# Patient Record
Sex: Male | Born: 1935 | Race: White | Hispanic: No | Marital: Married | Smoking: Never smoker
Health system: Southern US, Community
[De-identification: ages and names within clinical notes are randomized; demographics above are authoritative.]

## PROBLEM LIST (undated history)

## (undated) DIAGNOSIS — G459 Transient cerebral ischemic attack, unspecified: Secondary | ICD-10-CM

## (undated) DIAGNOSIS — I10 Essential (primary) hypertension: Secondary | ICD-10-CM

## (undated) DIAGNOSIS — J309 Allergic rhinitis, unspecified: Secondary | ICD-10-CM

## (undated) DIAGNOSIS — H409 Unspecified glaucoma: Secondary | ICD-10-CM

## (undated) DIAGNOSIS — I350 Nonrheumatic aortic (valve) stenosis: Secondary | ICD-10-CM

## (undated) DIAGNOSIS — M1990 Unspecified osteoarthritis, unspecified site: Secondary | ICD-10-CM

## (undated) DIAGNOSIS — C61 Malignant neoplasm of prostate: Secondary | ICD-10-CM

## (undated) DIAGNOSIS — E785 Hyperlipidemia, unspecified: Secondary | ICD-10-CM

## (undated) DIAGNOSIS — I442 Atrioventricular block, complete: Secondary | ICD-10-CM

## (undated) HISTORY — DX: Unspecified osteoarthritis, unspecified site: M19.90

## (undated) HISTORY — DX: Allergic rhinitis, unspecified: J30.9

## (undated) HISTORY — DX: Essential (primary) hypertension: I10

## (undated) HISTORY — DX: Transient cerebral ischemic attack, unspecified: G45.9

## (undated) HISTORY — PX: OTHER SURGICAL HISTORY: SHX169

## (undated) HISTORY — DX: Malignant neoplasm of prostate: C61

## (undated) HISTORY — DX: Nonrheumatic aortic (valve) stenosis: I35.0

## (undated) HISTORY — DX: Unspecified glaucoma: H40.9

## (undated) HISTORY — DX: Atrioventricular block, complete: I44.2

## (undated) HISTORY — DX: Hyperlipidemia, unspecified: E78.5

---

## 2001-12-24 HISTORY — PX: OTHER SURGICAL HISTORY: SHX169

## 2006-07-11 ENCOUNTER — Ambulatory Visit: Payer: Self-pay | Admitting: Internal Medicine

## 2006-07-29 ENCOUNTER — Ambulatory Visit: Payer: Self-pay | Admitting: Family Medicine

## 2006-08-07 ENCOUNTER — Ambulatory Visit: Payer: Self-pay | Admitting: Internal Medicine

## 2006-10-18 ENCOUNTER — Ambulatory Visit: Payer: Self-pay | Admitting: *Deleted

## 2006-11-11 ENCOUNTER — Ambulatory Visit: Payer: Self-pay | Admitting: Internal Medicine

## 2006-12-24 DIAGNOSIS — G459 Transient cerebral ischemic attack, unspecified: Secondary | ICD-10-CM

## 2006-12-24 HISTORY — DX: Transient cerebral ischemic attack, unspecified: G45.9

## 2006-12-24 HISTORY — PX: OTHER SURGICAL HISTORY: SHX169

## 2006-12-30 ENCOUNTER — Ambulatory Visit: Payer: Self-pay | Admitting: Internal Medicine

## 2007-01-02 ENCOUNTER — Ambulatory Visit: Payer: Self-pay

## 2007-01-02 ENCOUNTER — Encounter: Payer: Self-pay | Admitting: Cardiology

## 2007-01-29 ENCOUNTER — Ambulatory Visit: Payer: Self-pay | Admitting: Internal Medicine

## 2007-01-29 LAB — CONVERTED CEMR LAB
ALT: 16 U/L
Albumin: 3.2 g/dL — ABNORMAL LOW
BUN: 24 mg/dL — ABNORMAL HIGH
CO2: 31 meq/L
Calcium: 9.4 mg/dL
Chloride: 109 meq/L
Cholesterol: 158 mg/dL
Creatinine, Ser: 1.4 mg/dL
GFR calc Af Amer: 62 mL/min
GFR calc non Af Amer: 51 mL/min
Glucose, Bld: 96 mg/dL
HDL: 57.5 mg/dL
LDL Cholesterol: 81 mg/dL
Phosphorus: 3.7 mg/dL
Potassium: 5.1 meq/L
Sodium: 142 meq/L
Total CHOL/HDL Ratio: 2.7
Triglycerides: 99 mg/dL
VLDL: 20 mg/dL

## 2007-03-07 ENCOUNTER — Ambulatory Visit: Payer: Self-pay | Admitting: Internal Medicine

## 2007-04-11 ENCOUNTER — Ambulatory Visit: Payer: Self-pay | Admitting: Internal Medicine

## 2007-05-26 ENCOUNTER — Encounter: Payer: Self-pay | Admitting: Internal Medicine

## 2007-06-30 ENCOUNTER — Encounter: Payer: Self-pay | Admitting: Internal Medicine

## 2007-06-30 DIAGNOSIS — M109 Gout, unspecified: Secondary | ICD-10-CM

## 2007-06-30 DIAGNOSIS — I10 Essential (primary) hypertension: Secondary | ICD-10-CM | POA: Insufficient documentation

## 2007-07-09 ENCOUNTER — Ambulatory Visit: Payer: Self-pay | Admitting: Internal Medicine

## 2007-07-09 DIAGNOSIS — Z8679 Personal history of other diseases of the circulatory system: Secondary | ICD-10-CM | POA: Insufficient documentation

## 2007-07-09 DIAGNOSIS — E785 Hyperlipidemia, unspecified: Secondary | ICD-10-CM

## 2007-07-10 LAB — CONVERTED CEMR LAB
Albumin: 3.2 g/dL — ABNORMAL LOW (ref 3.5–5.2)
Creatinine, Ser: 1.3 mg/dL (ref 0.4–1.5)
GFR calc Af Amer: 67 mL/min
GFR calc non Af Amer: 56 mL/min
Hemoglobin: 14.5 g/dL (ref 13.0–17.0)
LDL Cholesterol: 87 mg/dL (ref 0–99)
Lymphocytes Relative: 25.2 % (ref 12.0–46.0)
MCHC: 33.4 g/dL (ref 30.0–36.0)
MCV: 98.7 fL (ref 78.0–100.0)
Monocytes Absolute: 0.9 10*3/uL — ABNORMAL HIGH (ref 0.2–0.7)
Neutro Abs: 4 10*3/uL (ref 1.4–7.7)
Phosphorus: 3.7 mg/dL (ref 2.3–4.6)
Platelets: 228 10*3/uL (ref 150–400)
Potassium: 4.9 meq/L (ref 3.5–5.1)
Total CHOL/HDL Ratio: 3.2
Triglycerides: 125 mg/dL (ref 0–149)
WBC: 6.7 10*3/uL (ref 4.5–10.5)

## 2007-07-23 ENCOUNTER — Ambulatory Visit: Payer: Self-pay | Admitting: Internal Medicine

## 2007-07-23 DIAGNOSIS — M26629 Arthralgia of temporomandibular joint, unspecified side: Secondary | ICD-10-CM

## 2007-09-29 ENCOUNTER — Other Ambulatory Visit: Payer: Self-pay

## 2007-09-29 ENCOUNTER — Ambulatory Visit: Payer: Self-pay | Admitting: Ophthalmology

## 2007-09-29 ENCOUNTER — Telehealth: Payer: Self-pay | Admitting: Internal Medicine

## 2007-10-06 ENCOUNTER — Ambulatory Visit: Payer: Self-pay | Admitting: Internal Medicine

## 2007-10-06 DIAGNOSIS — I442 Atrioventricular block, complete: Secondary | ICD-10-CM | POA: Insufficient documentation

## 2007-10-09 ENCOUNTER — Inpatient Hospital Stay (HOSPITAL_COMMUNITY): Admission: RE | Admit: 2007-10-09 | Discharge: 2007-10-10 | Payer: Self-pay | Admitting: *Deleted

## 2007-10-20 ENCOUNTER — Encounter: Admission: RE | Admit: 2007-10-20 | Discharge: 2007-10-20 | Payer: Self-pay | Admitting: Cardiology

## 2007-10-20 ENCOUNTER — Encounter: Payer: Self-pay | Admitting: Cardiology

## 2007-10-20 ENCOUNTER — Encounter: Payer: Self-pay | Admitting: Internal Medicine

## 2007-10-21 ENCOUNTER — Ambulatory Visit (HOSPITAL_COMMUNITY): Admission: RE | Admit: 2007-10-21 | Discharge: 2007-10-22 | Payer: Self-pay | Admitting: *Deleted

## 2007-10-25 HISTORY — PX: PACEMAKER INSERTION: SHX728

## 2007-11-24 ENCOUNTER — Encounter: Payer: Self-pay | Admitting: Internal Medicine

## 2007-12-30 ENCOUNTER — Emergency Department: Payer: Self-pay | Admitting: Emergency Medicine

## 2007-12-30 ENCOUNTER — Ambulatory Visit: Payer: Self-pay | Admitting: Family Medicine

## 2007-12-30 LAB — CONVERTED CEMR LAB
Nitrite: NEGATIVE
Urobilinogen, UA: NEGATIVE
WBC Urine, dipstick: NEGATIVE
pH: 6

## 2007-12-31 LAB — CONVERTED CEMR LAB
BUN: 25 mg/dL — ABNORMAL HIGH (ref 6–23)
Basophils Relative: 0.1 % (ref 0.0–1.0)
CO2: 26 meq/L (ref 19–32)
Chloride: 100 meq/L (ref 96–112)
Eosinophils Absolute: 0.1 10*3/uL (ref 0.0–0.6)
GFR calc non Af Amer: 47 mL/min
MCHC: 34 g/dL (ref 30.0–36.0)
MCV: 98.7 fL (ref 78.0–100.0)
Monocytes Absolute: 1.3 10*3/uL — ABNORMAL HIGH (ref 0.2–0.7)
Neutro Abs: 17.5 10*3/uL — ABNORMAL HIGH (ref 1.4–7.7)
RDW: 13.7 % (ref 11.5–14.6)
Sodium: 135 meq/L (ref 135–145)
TSH: 2.6 microintl units/mL (ref 0.35–5.50)
WBC: 20 10*3/uL (ref 4.5–10.5)

## 2008-01-02 ENCOUNTER — Ambulatory Visit: Payer: Self-pay | Admitting: Family Medicine

## 2008-01-12 ENCOUNTER — Ambulatory Visit: Payer: Self-pay | Admitting: Internal Medicine

## 2008-01-12 DIAGNOSIS — C7951 Secondary malignant neoplasm of bone: Secondary | ICD-10-CM

## 2008-01-12 DIAGNOSIS — C61 Malignant neoplasm of prostate: Secondary | ICD-10-CM | POA: Insufficient documentation

## 2008-01-14 LAB — CONVERTED CEMR LAB
AST: 20 units/L (ref 0–37)
Albumin: 3.1 g/dL — ABNORMAL LOW (ref 3.5–5.2)
Basophils Absolute: 0 10*3/uL (ref 0.0–0.1)
Basophils Relative: 0 % (ref 0.0–1.0)
CO2: 29 meq/L (ref 19–32)
Calcium: 9.1 mg/dL (ref 8.4–10.5)
Chloride: 103 meq/L (ref 96–112)
Creatinine, Ser: 1.5 mg/dL (ref 0.4–1.5)
Eosinophils Absolute: 0.1 10*3/uL (ref 0.0–0.6)
Eosinophils Relative: 1.4 % (ref 0.0–5.0)
GFR calc Af Amer: 57 mL/min
Hemoglobin: 13.8 g/dL (ref 13.0–17.0)
MCHC: 35.7 g/dL (ref 30.0–36.0)
Neutrophils Relative %: 69.8 % (ref 43.0–77.0)
Potassium: 4.5 meq/L (ref 3.5–5.1)
Total Protein: 6.1 g/dL (ref 6.0–8.3)
Triglycerides: 130 mg/dL (ref 0–149)
WBC: 9 10*3/uL (ref 4.5–10.5)

## 2008-05-20 ENCOUNTER — Ambulatory Visit: Payer: Self-pay | Admitting: Internal Medicine

## 2008-05-24 HISTORY — PX: OTHER SURGICAL HISTORY: SHX169

## 2008-06-10 ENCOUNTER — Encounter: Payer: Self-pay | Admitting: Internal Medicine

## 2008-06-14 ENCOUNTER — Ambulatory Visit: Payer: Self-pay | Admitting: Ophthalmology

## 2008-08-04 IMAGING — CR DG CHEST 2V
2 series · 2 of 2 positions shown · non-contrast
Comparison: CT 10/20/07 and plain films 10/10/07.

CLINICAL DATA: 89-year-old male, loose pacemaker lead.  Chest pain. 
 CHEST - 2 VIEW:

[w chest pa]
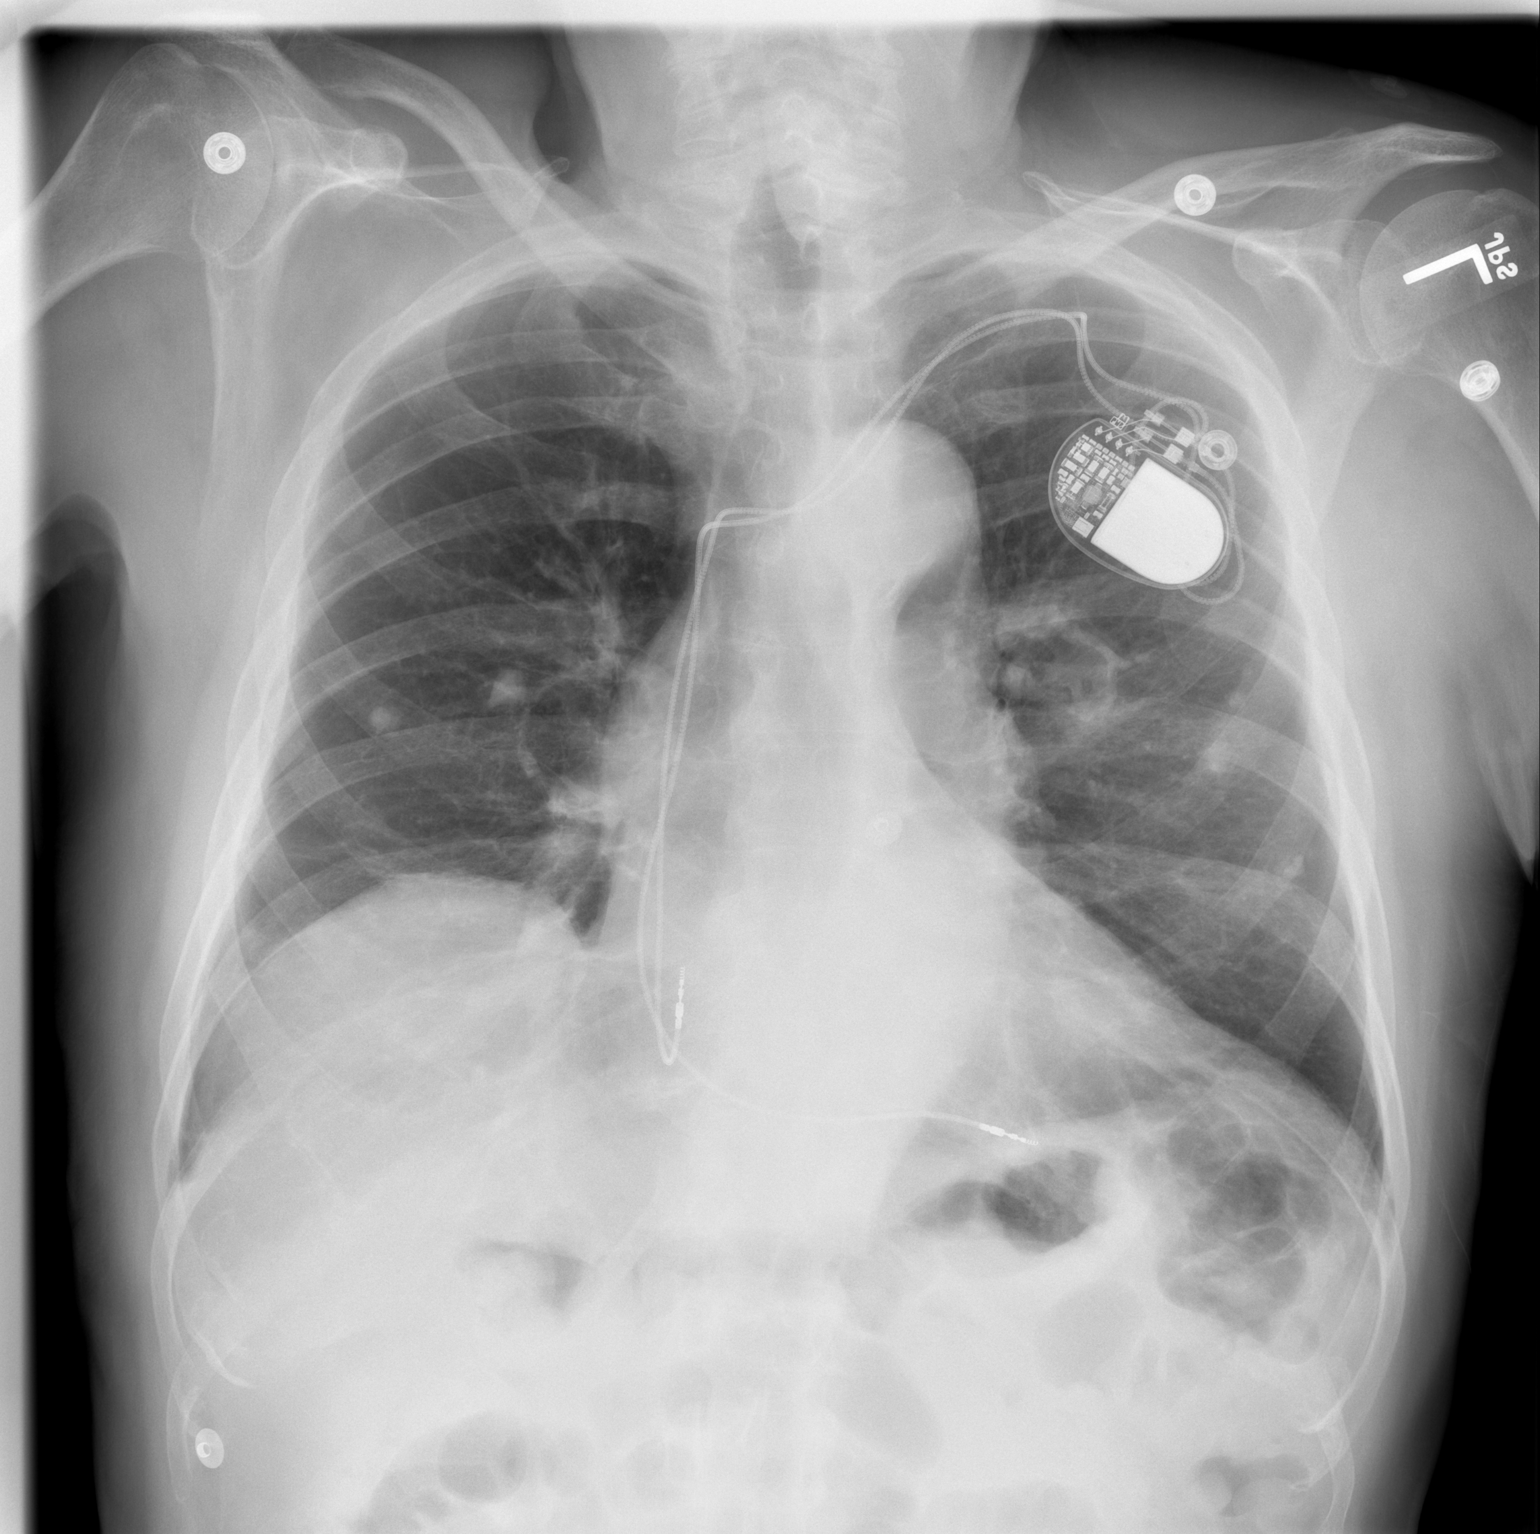

[w chest lat]
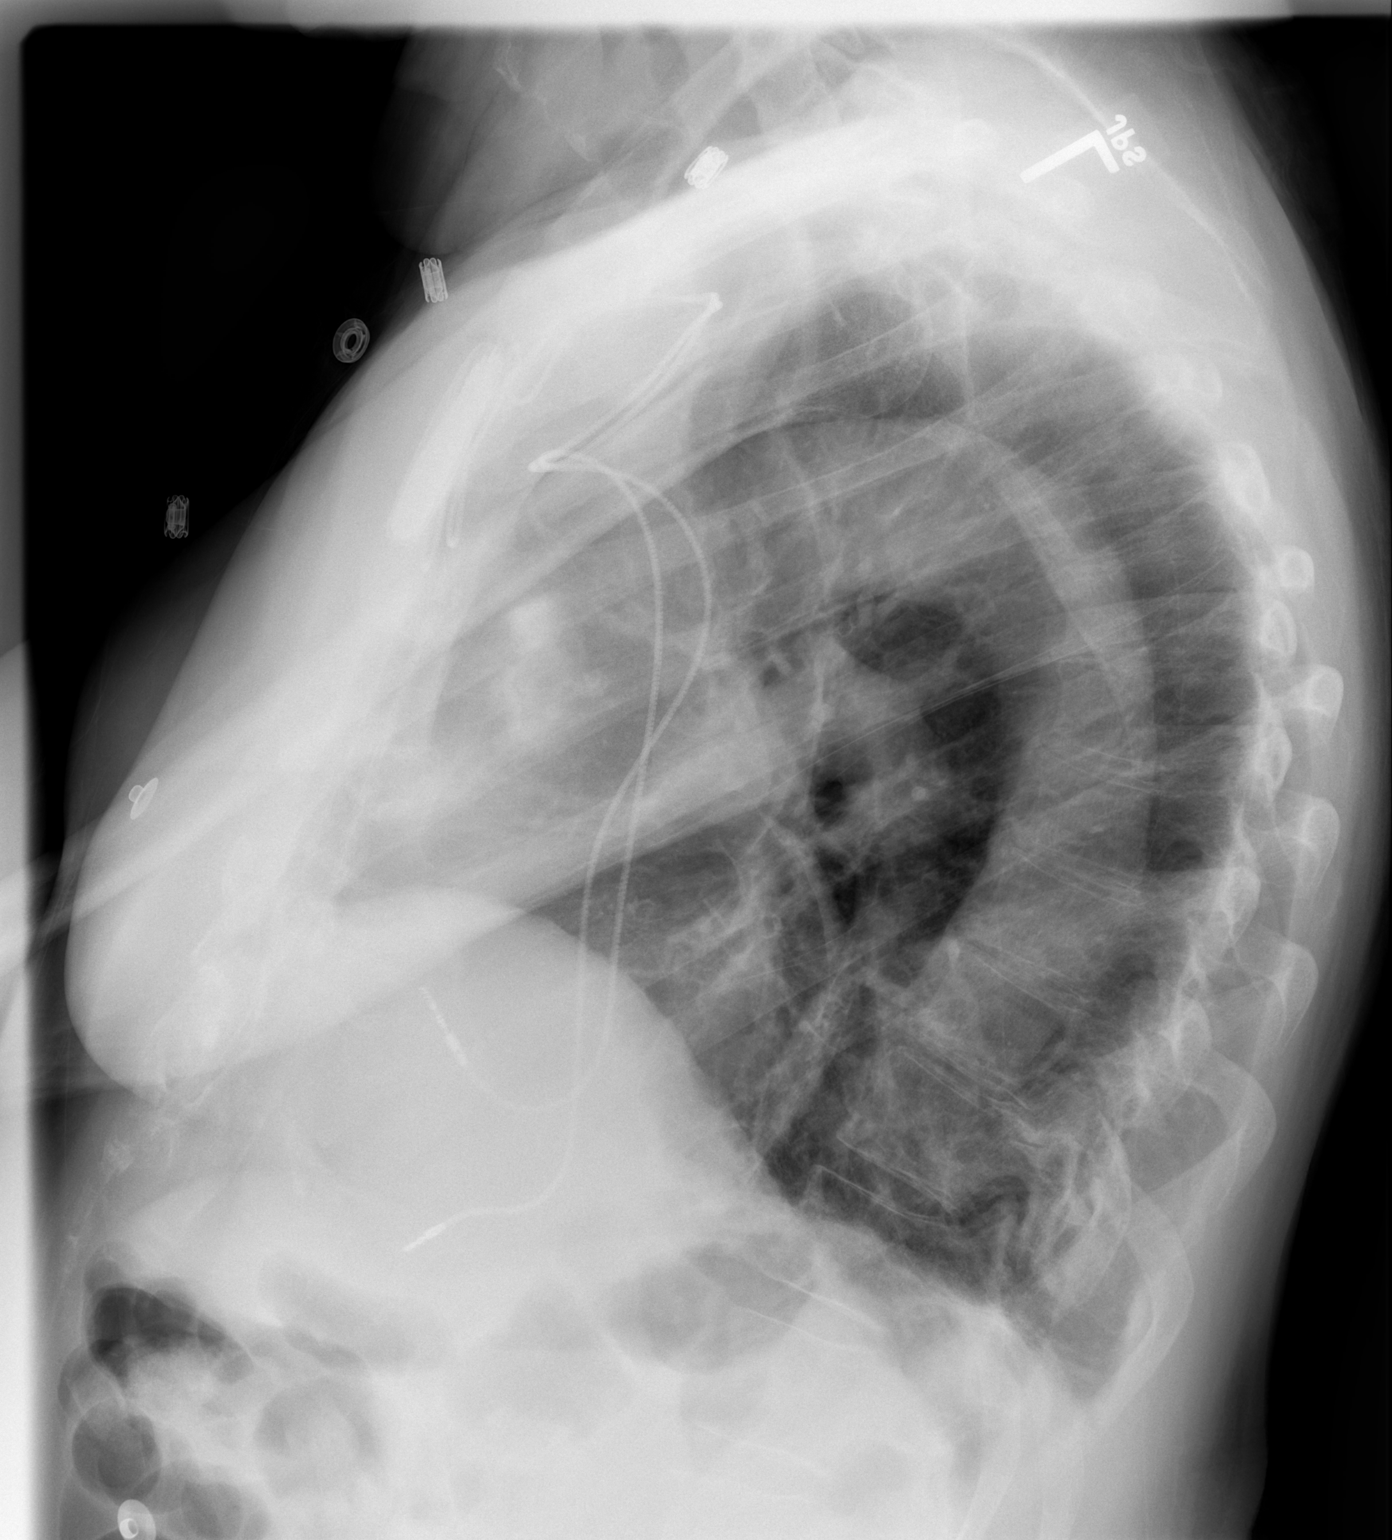

[2 of 2 positions shown; findings below may reference images not displayed]

FINDINGS: The wires of a dual lead pacemaker have been reconfigured.  The ventricular wire appears to be at the apex compared to the prior study.  The wires are intact.   Heart size is normal.  Multiple calcified pleural plaques are re-demonstrated.  The lungs are otherwise clear.
IMPRESSION: 1.  Reconfiguration of pacemaker wires.
 2.  No acute cardiopulmonary disease.

## 2008-09-08 ENCOUNTER — Telehealth: Payer: Self-pay | Admitting: Internal Medicine

## 2008-11-23 ENCOUNTER — Ambulatory Visit: Payer: Self-pay | Admitting: Internal Medicine

## 2008-11-24 LAB — CONVERTED CEMR LAB
ALT: 14 units/L (ref 0–53)
Albumin: 3.6 g/dL (ref 3.5–5.2)
Alkaline Phosphatase: 45 units/L (ref 39–117)
BUN: 28 mg/dL — ABNORMAL HIGH (ref 6–23)
Cholesterol: 167 mg/dL (ref 0–200)
HCT: 42.7 % (ref 39.0–52.0)
Hemoglobin: 14.5 g/dL (ref 13.0–17.0)
MCHC: 33.9 g/dL (ref 30.0–36.0)
MCV: 99.9 fL (ref 78.0–100.0)
Neutro Abs: 3.8 10*3/uL (ref 1.4–7.7)
Neutrophils Relative %: 58.9 % (ref 43.0–77.0)
Potassium: 4.6 meq/L (ref 3.5–5.1)
RBC: 4.28 M/uL (ref 4.22–5.81)
Sodium: 138 meq/L (ref 135–145)
Total Bilirubin: 1.1 mg/dL (ref 0.3–1.2)
Total Protein: 6.8 g/dL (ref 6.0–8.3)
Triglycerides: 101 mg/dL (ref 0–149)
WBC: 6.4 10*3/uL (ref 4.5–10.5)

## 2008-12-01 ENCOUNTER — Encounter: Payer: Self-pay | Admitting: Internal Medicine

## 2008-12-09 ENCOUNTER — Telehealth: Payer: Self-pay | Admitting: Internal Medicine

## 2009-01-24 ENCOUNTER — Ambulatory Visit: Payer: Self-pay | Admitting: Internal Medicine

## 2009-01-24 DIAGNOSIS — N644 Mastodynia: Secondary | ICD-10-CM

## 2009-03-16 ENCOUNTER — Ambulatory Visit: Payer: Self-pay | Admitting: Ophthalmology

## 2009-05-19 ENCOUNTER — Ambulatory Visit: Payer: Self-pay | Admitting: Internal Medicine

## 2009-06-29 ENCOUNTER — Encounter: Payer: Self-pay | Admitting: Internal Medicine

## 2009-07-26 ENCOUNTER — Ambulatory Visit: Payer: Self-pay | Admitting: Internal Medicine

## 2009-07-27 LAB — CONVERTED CEMR LAB
AST: 19 units/L (ref 0–37)
Alkaline Phosphatase: 48 units/L (ref 39–117)
Basophils Absolute: 0 10*3/uL (ref 0.0–0.1)
Basophils Relative: 0.5 % (ref 0.0–3.0)
CO2: 29 meq/L (ref 19–32)
Cholesterol: 163 mg/dL (ref 0–200)
Creatinine, Ser: 1.2 mg/dL (ref 0.4–1.5)
Eosinophils Absolute: 0.2 10*3/uL (ref 0.0–0.7)
Glucose, Bld: 94 mg/dL (ref 70–99)
HDL: 51.6 mg/dL (ref >39.00)
Hemoglobin: 13.8 g/dL (ref 13.0–17.0)
LDL Cholesterol: 93 mg/dL (ref 0–99)
MCHC: 33.9 g/dL (ref 30.0–36.0)
MCV: 100.2 fL — ABNORMAL HIGH (ref 78.0–100.0)
Neutrophils Relative %: 61.3 % (ref 43.0–77.0)
Phosphorus: 3.1 mg/dL (ref 2.3–4.6)
Platelets: 196 10*3/uL (ref 150.0–400.0)
Potassium: 4.7 meq/L (ref 3.5–5.1)
RBC: 4.08 M/uL — ABNORMAL LOW (ref 4.22–5.81)
RDW: 13.1 % (ref 11.5–14.6)
VLDL: 18.2 mg/dL (ref 0.0–40.0)

## 2009-09-23 ENCOUNTER — Ambulatory Visit: Payer: Self-pay | Admitting: Internal Medicine

## 2009-10-06 ENCOUNTER — Ambulatory Visit: Payer: Self-pay | Admitting: Family Medicine

## 2009-10-19 ENCOUNTER — Telehealth: Payer: Self-pay | Admitting: Internal Medicine

## 2009-11-07 ENCOUNTER — Ambulatory Visit: Payer: Self-pay | Admitting: Internal Medicine

## 2009-11-07 DIAGNOSIS — M25559 Pain in unspecified hip: Secondary | ICD-10-CM

## 2009-12-21 ENCOUNTER — Ambulatory Visit: Payer: Self-pay | Admitting: Family Medicine

## 2009-12-21 ENCOUNTER — Encounter: Payer: Self-pay | Admitting: Internal Medicine

## 2009-12-21 DIAGNOSIS — M545 Low back pain: Secondary | ICD-10-CM

## 2009-12-24 ENCOUNTER — Encounter: Payer: Self-pay | Admitting: Internal Medicine

## 2009-12-24 ENCOUNTER — Emergency Department: Payer: Self-pay | Admitting: Emergency Medicine

## 2009-12-26 ENCOUNTER — Emergency Department: Payer: Self-pay | Admitting: Unknown Physician Specialty

## 2009-12-26 ENCOUNTER — Encounter: Payer: Self-pay | Admitting: Internal Medicine

## 2010-01-02 ENCOUNTER — Encounter: Payer: Self-pay | Admitting: Family Medicine

## 2010-01-05 ENCOUNTER — Ambulatory Visit: Payer: Self-pay | Admitting: Internal Medicine

## 2010-01-13 ENCOUNTER — Encounter: Payer: Self-pay | Admitting: Internal Medicine

## 2010-01-17 ENCOUNTER — Encounter: Payer: Self-pay | Admitting: Family Medicine

## 2010-01-19 ENCOUNTER — Encounter: Payer: Self-pay | Admitting: Internal Medicine

## 2010-01-24 ENCOUNTER — Ambulatory Visit: Payer: Self-pay | Admitting: Urology

## 2010-02-01 ENCOUNTER — Encounter: Payer: Self-pay | Admitting: Internal Medicine

## 2010-02-10 ENCOUNTER — Encounter: Payer: Self-pay | Admitting: Family Medicine

## 2010-02-13 ENCOUNTER — Ambulatory Visit: Payer: Self-pay | Admitting: Internal Medicine

## 2010-02-21 ENCOUNTER — Telehealth: Payer: Self-pay | Admitting: Cardiovascular Disease

## 2010-03-06 ENCOUNTER — Encounter: Payer: Self-pay | Admitting: Internal Medicine

## 2010-03-07 ENCOUNTER — Telehealth: Payer: Self-pay | Admitting: Cardiovascular Disease

## 2010-04-18 ENCOUNTER — Ambulatory Visit: Payer: Self-pay | Admitting: Internal Medicine

## 2010-04-18 DIAGNOSIS — I359 Nonrheumatic aortic valve disorder, unspecified: Secondary | ICD-10-CM | POA: Insufficient documentation

## 2010-04-18 DIAGNOSIS — M1990 Unspecified osteoarthritis, unspecified site: Secondary | ICD-10-CM | POA: Insufficient documentation

## 2010-04-19 LAB — CONVERTED CEMR LAB
ALT: 19 units/L (ref 0–53)
AST: 22 units/L (ref 0–37)
Basophils Absolute: 0 10*3/uL (ref 0.0–0.1)
Basophils Relative: 0.3 % (ref 0.0–3.0)
Bilirubin, Direct: 0.2 mg/dL (ref 0.0–0.3)
CO2: 31 meq/L (ref 19–32)
Chloride: 107 meq/L (ref 96–112)
Cholesterol: 169 mg/dL (ref 0–200)
Eosinophils Relative: 5.3 % — ABNORMAL HIGH (ref 0.0–5.0)
Glucose, Bld: 105 mg/dL — ABNORMAL HIGH (ref 70–99)
HDL: 54.3 mg/dL (ref >39.00)
Hemoglobin: 13.3 g/dL (ref 13.0–17.0)
Lymphocytes Relative: 22.5 % (ref 12.0–46.0)
Lymphs Abs: 1.6 10*3/uL (ref 0.7–4.0)
MCHC: 33.8 g/dL (ref 30.0–36.0)
Monocytes Absolute: 0.7 10*3/uL (ref 0.1–1.0)
Monocytes Relative: 10.4 % (ref 3.0–12.0)
Neutro Abs: 4.3 10*3/uL (ref 1.4–7.7)
Sodium: 143 meq/L (ref 135–145)
Total Bilirubin: 0.6 mg/dL (ref 0.3–1.2)
Total CHOL/HDL Ratio: 3
Total Protein: 5.8 g/dL — ABNORMAL LOW (ref 6.0–8.3)
Triglycerides: 175 mg/dL — ABNORMAL HIGH (ref 0.0–149.0)

## 2010-05-10 ENCOUNTER — Encounter: Payer: Self-pay | Admitting: Internal Medicine

## 2010-05-18 ENCOUNTER — Telehealth: Payer: Self-pay | Admitting: Internal Medicine

## 2010-05-18 ENCOUNTER — Ambulatory Visit: Payer: Self-pay | Admitting: Internal Medicine

## 2010-05-18 DIAGNOSIS — R609 Edema, unspecified: Secondary | ICD-10-CM

## 2010-06-19 ENCOUNTER — Ambulatory Visit: Payer: Self-pay | Admitting: Ophthalmology

## 2010-06-27 ENCOUNTER — Ambulatory Visit: Payer: Self-pay | Admitting: Ophthalmology

## 2010-09-13 ENCOUNTER — Encounter: Payer: Self-pay | Admitting: Internal Medicine

## 2010-09-21 ENCOUNTER — Ambulatory Visit: Payer: Self-pay | Admitting: Ophthalmology

## 2010-11-23 ENCOUNTER — Ambulatory Visit: Payer: Self-pay | Admitting: Cardiovascular Disease

## 2010-12-14 ENCOUNTER — Encounter: Payer: Self-pay | Admitting: Internal Medicine

## 2011-01-08 ENCOUNTER — Encounter (INDEPENDENT_AMBULATORY_CARE_PROVIDER_SITE_OTHER): Payer: Self-pay | Admitting: *Deleted

## 2011-01-15 ENCOUNTER — Ambulatory Visit
Admission: RE | Admit: 2011-01-15 | Discharge: 2011-01-15 | Payer: Self-pay | Source: Home / Self Care | Attending: Internal Medicine | Admitting: Internal Medicine

## 2011-01-15 ENCOUNTER — Encounter: Payer: Self-pay | Admitting: Internal Medicine

## 2011-01-23 NOTE — Assessment & Plan Note (Signed)
Summary: Left side pain, constipation   Vital Signs:  Patient profile:   77 year old male Weight:      182 pounds Temp:     97.4 degrees F oral Pulse rate:   64 / minute Pulse rhythm:   regular BP sitting:   140 / 80  (left arm) Cuff size:   large  Vitals Entered By: Mervin Hack CMA Duncan Dull) (January 05, 2010 4:55 PM) CC: hospital follow-up   History of Present Illness: Had ER visit twice last week Abdominal problems  2nd visit --he hadn't been able to move his bowels Cleaned him out with prep Had CT that visit Multiple old renal cysts, diverticulosis, prostate mass  had taken lactulose in past then started miralax but wasn't using regularly  appetite is okay  Allergies: 1)  * Sulfa (Sulfonamides) Group  Past History:  Past medical, surgical, family and social histories (including risk factors) reviewed for relevance to current acute and chronic problems.  Past Medical History: Reviewed history from 07/26/2009 and no changes required. Prostate cancer--------------------------------------------Dr Cope Gout Hypertension TIA     1/08 Hyperlipidemia Complete heart block-------------------------------------Dr [place] Aortic stenosis  Past Surgical History: Reviewed history from 11/23/2008 and no changes required.  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 Father:Died 42, old age  Mother: Died 24, Rheumatic fever complications Siblings: None GP's:  Grandparent died of pneumonia DM:  Dad (late in life) No prostate or colon cancer  Social History: Reviewed history from 11/23/2008 and no changes required. Never Smoked Alcohol use-yes Drug use-no Regular exercise-yes, occasional gym, rare golf, swims Marital Status: Married, 2nd Children: 3, [place] and  [place] Occupation: Forensic scientist Enjoys bridge and poker  Review of Systems       has been getting PT for left hip Leg pain is better moving around some better  Physical Exam  General:  alert and normal appearance.   Lungs:  normal respiratory effort and normal breath sounds.   Abdomen:  soft, non-tender, normal bowel sounds, and no masses.   Psych:  normally interactive, good eye contact, not anxious appearing, and not depressed appearing.     Impression & Recommendations:  Problem # 1:  OTHER CONSTIPATION (ICD-564.09) Assessment New will have him start the miralax daily  His updated medication list for this problem includes:    Miralax Powd (Polyethylene glycol 3350) .Marland Kitchen... 1 capful with water daily to prevent constipation  Problem # 2:  PROSTATE CANCER (ICD-185) Assessment: Comment Only mass on CT at his age, and with no signs of cancer outside of prostate, I don't think any action is appropriate He has regular follow up with Dr Achilles Dunk  Complete Medication List: 1)  Pravastatin Sodium 20 Mg Tabs (Pravastatin sodium) .... Take 1 tablet by mouth once a day 2)  Allopurinol 300 Mg Tabs (Allopurinol) .Marland Kitchen.. 1 by mouth daily 3)  Avodart 0.5 Mg Caps (Dutasteride) .... Take 1 tablet by mouth every other day 4)  Adult Aspirin Ec Low Strength 81 Mg Tbec (Aspirin) .... Once daily 5)  Cozaar 50 Mg Tabs (Losartan potassium) .Marland Kitchen.. 1 daily 6)  Lorazepam 0.5 Mg Tabs (Lorazepam) .Marland Kitchen.. 1 tab at bedtime as needed for teeth grinding 7)  Acetaminophen 325 Mg Tabs (Acetaminophen) .... 2 tabs by mouth up to four  times daily for hip or leg pain 8)  Miralax Powd (Polyethylene glycol 3350) .Marland Kitchen.. 1 capful with water daily to prevent constipation  Patient Instructions: 1)  Please keep appt on February 8th  Current Allergies (reviewed today): * SULFA (SULFONAMIDES) GROUP

## 2011-01-23 NOTE — Miscellaneous (Signed)
Summary: PT Order & Initial Eval/TLC  PT Order & Initial Eval/TLC   Imported By: Lanelle Bal 01/13/2010 08:11:27  _____________________________________________________________________  External Attachment:    Type:   Image     Comment:   External Document

## 2011-01-23 NOTE — Assessment & Plan Note (Signed)
Summary: F/U PER DR Alnita Aybar/[facility]   Vital Signs:  Patient profile:   77 year old male Weight:      176 pounds Temp:     97.9 degrees F oral Pulse rate:   60 / minute Pulse rhythm:   regular BP sitting:   128 / 80  (left arm) Cuff size:   large  Vitals Entered By: Mervin Hack CMA Duncan Dull) (April 18, 2010 11:19 AM) CC: follow-up visit   History of Present Illness: feeling fairly well got 1 shot of lupron 5 months ago, PSA went from 75 to 5 No more needed  Wife has been better Much stress off his shoulders Understands that he has limitations at his age  No SOB Does occ feel slight tightness in chest if he works hard in garden--very brief and quickly eases off no sig edema No dizziness or syncope  Some tightness in right leg when first getting up Loosens up after a short while  No gout problems on the allopurinol  Allergies: 1)  * Sulfa (Sulfonamides) Group  Past History:  Past medical, surgical, family and social histories (including risk factors) reviewed for relevance to current acute and chronic problems.  Past Medical History: Prostate cancer--------------------------------------------Dr Cope Gout Hypertension TIA     1/08 Hyperlipidemia Complete heart block-------------------------------------Dr [place] Aortic stenosis Allergic rhinitis Osteoarthritis  Past Surgical History: Reviewed history from 11/23/2008 and no changes required.  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 Father:Died 67, old age  Mother: Died 51, Rheumatic fever complications Siblings: None GP's:  Grandparent died of pneumonia DM:  Dad (late in life) No prostate or colon cancer  Social History: Reviewed history from 11/23/2008 and no changes required. Never Smoked Alcohol use-yes Drug  use-no Regular exercise-yes, occasional gym, rare golf, swims Marital Status: Married, 2nd Children: 3, [place] and [place] Occupation: Forensic scientist Enjoys bridge and poker  Review of Systems       appetite is good weight is stable sleeps well  Physical Exam  General:  alert and normal appearance.   Neck:  supple, no masses, no thyromegaly, and no cervical lymphadenopathy.   Lungs:  normal respiratory effort and normal breath sounds.   Heart:  normal rate, regular rhythm, and no gallop.   Gr 3/6 coarse systolic murmur in aortic area Msk:  limited internal rotation of right hip left hip fairly normal ROM Extremities:  no sig edema Psych:  normally interactive, good eye contact, not anxious appearing, and not depressed appearing.     Impression & Recommendations:  Problem # 1:  HYPERTENSION (ICD-401.9) Assessment Unchanged  good control due for labs  His updated medication list for this problem includes:    Cozaar 50 Mg Tabs (Losartan potassium) .Marland Kitchen... 1 daily  BP today: 128/80 Prior BP: 138/80 (02/13/2010)  Labs Reviewed: K+: 4.7 (07/26/2009) Creat: : 1.2 (07/26/2009)   Chol: 163 (07/26/2009)   HDL: 51.60 (07/26/2009)   LDL: 93 (07/26/2009)   TG: 91.0 (07/26/2009)  Orders: TLB-Renal Function Panel (80069-RENAL) TLB-CBC Platelet - w/Differential (85025-CBCD) TLB-TSH (Thyroid Stimulating Hormone) (84443-TSH) Venipuncture (16109)  Problem # 2:  AORTIC STENOSIS (ICD-424.1) Assessment: Comment Only probably cause of mild chest symptoms no Rx needed  His updated medication list for this problem includes:    Adult Aspirin Ec Low  Strength 81 Mg Tbec (Aspirin) ..... Once daily  Problem # 3:  HYPERLIPIDEMIA (ICD-272.4) Assessment: Unchanged  no problems with med will check labs  His updated medication list for this problem includes:    Pravastatin Sodium 20 Mg Tabs (Pravastatin sodium) .Marland Kitchen... Take 1 tablet by mouth once a day  Labs Reviewed: SGOT: 19  (07/26/2009)   SGPT: 18 (07/26/2009)   HDL:51.60 (07/26/2009), 69.1 (11/23/2008)  LDL:93 (07/26/2009), 78 (11/23/2008)  Chol:163 (07/26/2009), 167 (11/23/2008)  Trig:91.0 (07/26/2009), 101 (11/23/2008)  Orders: TLB-Lipid Panel (80061-LIPID) TLB-Hepatic/Liver Function Pnl (80076-HEPATIC)  Problem # 4:  HIP PAIN, RIGHT (ICD-719.45) Assessment: Unchanged clearly seems osteoarthritic  His updated medication list for this problem includes:    Adult Aspirin Ec Low Strength 81 Mg Tbec (Aspirin) ..... Once daily    Acetaminophen 325 Mg Tabs (Acetaminophen) .Marland Kitchen... 2 tabs by mouth up to four times daily for hip or leg pain  Complete Medication List: 1)  Pravastatin Sodium 20 Mg Tabs (Pravastatin sodium) .... Take 1 tablet by mouth once a day 2)  Allopurinol 300 Mg Tabs (Allopurinol) .Marland Kitchen.. 1 by mouth daily 3)  Adult Aspirin Ec Low Strength 81 Mg Tbec (Aspirin) .... Once daily 4)  Cozaar 50 Mg Tabs (Losartan potassium) .Marland Kitchen.. 1 daily 5)  Lorazepam 0.5 Mg Tabs (Lorazepam) .Marland Kitchen.. 1 tab at bedtime as needed for teeth grinding 6)  Acetaminophen 325 Mg Tabs (Acetaminophen) .... 2 tabs by mouth up to four times daily for hip or leg pain 7)  Miralax Powd (Polyethylene glycol 3350) .Marland Kitchen.. 1 capful with water daily to prevent constipation 8)  Loratadine 10 Mg Tabs (Loratadine) .Marland Kitchen.. 1-2 tabs by mouth daily as needed for allergies  Patient Instructions: 1)  Please schedule a follow-up appointment in 6 months .   Current Allergies (reviewed today): * SULFA (SULFONAMIDES) GROUP

## 2011-01-23 NOTE — Assessment & Plan Note (Signed)
Summary: NEW PT   Visit Type:  Initial Consult Primary Provider:  Dr. Burnett Sheng  CC:  Denies SOB and chest pain or palpitations.  History of Present Illness: 77 yo male, caretaker for his wife, rose Fenoglio, hx of complete heart block, lead revision in 09/2007, HTN, hyperlipidemia, who presents to establish care.   Overall, he reports that he is doing well. He has no complaints, walks one mild per day with no chest pain or SOB. "I am getting old!" He does the cooking, groceries and cleaning for his wife. He is frustrated by her condition, and her inability to keep up with him.   ECHO: 12/2006: essentially a normal study, EF 65%  Cholesterol 169 on pravastatin 20 mg daily. No known CAD or PVD  Current Medications (verified): 1)  Pravastatin Sodium 20 Mg Tabs (Pravastatin Sodium) .... Take 1 Tablet By Mouth Once A Day 2)  Allopurinol 300 Mg  Tabs (Allopurinol) .Marland Kitchen.. 1 By Mouth Daily 3)  Adult Aspirin Ec Low Strength 81 Mg  Tbec (Aspirin) .... Once Daily 4)  Cozaar 50 Mg  Tabs (Losartan Potassium) .Marland Kitchen.. 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       Echo (-) Carotids (-) 11/08 Pacemaker 6/09  Cataract right eye  Family History: Last updated: 07-04-2007 Father:Died 50, old age  Mother: Died 83, Rheumatic fever complications Siblings: None GP's:  Grandparent died of pneumonia DM:  Dad (late in life) No prostate or colon cancer  Social History: Last updated: 11/23/2008 Never Smoked Alcohol use-yes Drug use-no Regular  exercise-yes, occasional gym, rare golf, swims Marital Status: Married, 2nd Children: 3, [place] and [place] Occupation: Forensic scientist Enjoys bridge and poker  Risk Factors: Exercise: yes (2007/07/04)  Risk Factors: Smoking Status: never (04-Jul-2007)  Review of Systems  The patient denies fever, weight loss, weight gain, vision loss, decreased hearing, hoarseness, chest pain, syncope, dyspnea on exertion, peripheral edema, prolonged cough, abdominal pain, incontinence, muscle weakness, depression, and enlarged lymph nodes.    Vital Signs:  Patient profile:   77 year old male Height:      68.5 inches Weight:      184 pounds BMI:     27.67 Pulse rate:   75 / minute BP sitting:   130 / 68  (left arm)  Vitals Entered By: Lysbeth Galas CMA (November 23, 2010 10:18 AM)  Physical Exam  General:  Well developed, well nourished, in no acute distress.Elderly male.  Head:  normocephalic and atraumatic Neck:  Neck supple, no JVD. No masses, thyromegaly or abnormal cervical nodes. Lungs:  Clear bilaterally to auscultation and percussion. Heart:  Non-displaced PMI, chest non-tender; regular rate and rhythm, S1, S2 without murmurs, rubs or gallops. Carotid upstroke normal, no bruit.  Pedals normal pulses. No edema, no varicosities. Abdomen:  Bowel sounds positive; abdomen soft and non-tender without masses Msk:  Back normal, normal gait. Muscle strength and tone normal. Pulses:  pulses normal in all 4 extremities Extremities:  No clubbing or cyanosis. Neurologic:  Alert and oriented x 3. Skin:  Intact without lesions or rashes. Psych:  Normal affect.   PPM Specifications Following MD:  Viviann Spare  Graciela Husbands, MD     PPM Vendor:  [employer]     PPM Model Number:  [id number]     PPM Serial Number:  [id number] PPM DOI:  10/09/2007     PPM Implanting MD:  NOT IMPLANTED BY [place]  Lead 1    Location: [facility]     DOI: 10/09/2007     Model #: [id number]     Serial #: [id number]     Status: active Lead 2     Location: RV     DOI: 10/09/2007     Model #: [id number]     Serial #: [id number]     Status: active  Magnet Response Rate:  BOL 85 ERI  65  Indications:  CHB   PPM Follow Up Pacer Dependent:  Yes      Episodes Coumadin:  No  Parameters Mode:  DDDR     Lower Rate Limit:  60     Upper Rate Limit:  130 Paced AV Delay:  150     Sensed AV Delay:  120  Impression & Recommendations:  Problem # 1:  EDEMA (ICD-782.3) Edema is likely secondary to chronic venous insufficieny. I have suggested leg elevation, TED hose.  Problem # 2:  AORTIC STENOSIS (ICD-424.1) AV sclerosis with no significant stenosis by echo 3 yrs ago. No symptoms at this time.  His updated medication list for this problem includes:    Cozaar 50 Mg Tabs (Losartan potassium) .Marland Kitchen... 1 daily  Problem # 3:  PACEMAKER, MDT DDD (ICD-V45.01) H/O complete heart block.  Will defer pacer follow up to [place] [facility].  Problem # 4:  HYPERLIPIDEMIA (ICD-272.4) Would continue current statin.  His updated medication list for this problem includes:    Pravastatin Sodium 20 Mg Tabs (Pravastatin sodium) .Marland Kitchen... Take 1 tablet by mouth once a day  Patient Instructions: 1)  Your physician recommends that you schedule a follow-up appointment in: 6 months 2)  Your physician recommends that you continue on your current medications as directed. Please refer to the Current Medication list given to you today.

## 2011-01-23 NOTE — Assessment & Plan Note (Signed)
Summary: FOLLOW-UP/DS   Vital Signs:  Patient profile:   77 year old male Weight:      177 pounds Temp:     97.6 degrees F oral Pulse rate:   60 / minute Pulse rhythm:   regular BP sitting:   138 / 80  (left arm) Cuff size:   large  Vitals Entered By: Mervin Hack CMA Duncan Dull) (February 13, 2010 10:34 AM) CC: follow-up visit   History of Present Illness: Wife still in hospital diagnosis is unclear stress with this She has had pleural effusions, some mood issues and ??early dementia  Awoke with sneezing and coughing No fever No SOB feels some better now  Voiding okay got lupron shot and it seems to have helped pain is gone--in hips  Bowels are better now Actually some on the loose side so he stopped the miralax   Allergies: 1)  * Sulfa (Sulfonamides) Group  Past History:  Past medical, surgical, family and social histories (including risk factors) reviewed for relevance to current acute and chronic problems.  Past Medical History: Prostate cancer--------------------------------------------Dr Cope Gout Hypertension TIA     1/08 Hyperlipidemia Complete heart block-------------------------------------Dr [place] Aortic stenosis Allergic rhinitis  Past Surgical History: Reviewed history from 11/23/2008 and no changes required.  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 Father:Died 3, old age  Mother: Died 43, Rheumatic fever complications Siblings: None GP's:  Grandparent died of pneumonia DM:  Dad (late in life) No prostate or colon cancer  Social History: Reviewed history from 11/23/2008 and no changes required. Never Smoked Alcohol use-yes Drug use-no Regular exercise-yes, occasional gym, rare golf, swims Marital Status: Married, 2nd Children: 3, [place] and  [place] Occupation: Forensic scientist Enjoys bridge and poker  Review of Systems       Appetite is fine weight down 5# since last visit--eating has been sporadic since wife has been sick sleeping fairly well  Physical Exam  General:  alert and normal appearance.   Nose:  mild congestion Mouth:  no erythema and no exudates.   Neck:  supple, no masses, and no thyromegaly.   Lungs:  normal respiratory effort and normal breath sounds.   Heart:  normal rate, regular rhythm, and no gallop.   Gr 3/6 systolic murmur loudest at base Abdomen:  soft and non-tender.   Extremities:  no edema Psych:  normally interactive and good eye contact.     Impression & Recommendations:  Problem # 1:  ALLERGIC RHINITIS (ICD-477.9) Assessment New can use the OTC med as needed   His updated medication list for this problem includes:    Loratadine 10 Mg Tabs (Loratadine) .Marland Kitchen... 1-2 tabs by mouth daily as needed for allergies  Problem # 2:  OTHER CONSTIPATION (ICD-564.09) Assessment: Improved urged him to use the miralax regularly instead of waiting---suggested every other day  His updated medication list for this problem includes:    Miralax Powd (Polyethylene glycol 3350) .Marland Kitchen... 1 capful with water daily to prevent constipation  Problem # 3:  PROSTATE CANCER (ICD-185) Assessment: Improved seems to have clinically responded to the lupron  Complete Medication List: 1)  Pravastatin Sodium 20 Mg Tabs (Pravastatin sodium) .... Take 1 tablet by mouth once a day 2)  Allopurinol 300 Mg Tabs (Allopurinol) .Marland Kitchen.. 1 by mouth  daily 3)  Avodart 0.5 Mg Caps (Dutasteride) .... Take 1 tablet by mouth every other day 4)  Adult Aspirin Ec Low Strength 81 Mg Tbec (Aspirin) .... Once daily 5)  Cozaar 50 Mg Tabs (Losartan potassium) .Marland Kitchen.. 1 daily 6)  Lorazepam 0.5 Mg Tabs (Lorazepam) .Marland Kitchen.. 1 tab at bedtime as needed for teeth grinding 7)  Acetaminophen 325 Mg Tabs (Acetaminophen) .... 2 tabs by mouth up to four  times daily for hip or leg pain 8)  Miralax Powd (Polyethylene glycol 3350) .Marland Kitchen.. 1 capful with water daily to prevent constipation 9)  Loratadine 10 Mg Tabs (Loratadine) .Marland Kitchen.. 1-2 tabs by mouth daily as needed for allergies  Patient Instructions: 1)  Please try loratadine 10mg  1-2 tabs daily for allergy symptoms (available over the counter) 2)  Please take the miralax every other day to make sure you don't get constipated again 3)  Please schedule a follow-up appointment in 3 months .   Current Allergies (reviewed today): * SULFA (SULFONAMIDES) GROUP

## 2011-01-23 NOTE — Letter (Signed)
Summary: PHI  PHI   Imported By: Harlon Flor 05/18/2010 10:39:02  _____________________________________________________________________  External Attachment:    Type:   Image     Comment:   External Document

## 2011-01-23 NOTE — Letter (Signed)
Summary: [facility]   Imported By: Lanelle Bal 01/20/2010 14:06:34  _____________________________________________________________________  External Attachment:    Type:   Image     Comment:   External Document  Appended Document: [facility] PSA 50 considering Rx

## 2011-01-23 NOTE — Letter (Signed)
Summary: [facility]   Imported By: Maryln Gottron 09/22/2010 13:13:06  _____________________________________________________________________  External Attachment:    Type:   Image     Comment:   External Document  Appended Document: [facility] doing well on lupron---dose given PSA down

## 2011-01-23 NOTE — Letter (Signed)
Summary: [facility]  [facility]   Imported By: Lanelle Bal 02/06/2010 08:10:58  _____________________________________________________________________  External Attachment:    Type:   Image     Comment:   External Document  Appended Document: [facility] vicodin Rx given for pain Follow up in 1 month with Dr Achilles Dunk with PSA before to document response to lupron

## 2011-01-23 NOTE — Letter (Signed)
Summary: [facility]  [facility]   Imported By: Maryln Gottron 03/13/2010 09:45:16  _____________________________________________________________________  External Attachment:    Type:   Image     Comment:   External Document  Appended Document: [facility] responded to hormone Rx now with fatigue---may be due to stress with wife in hospital, etc will decide about further hormone therapy based on PSA response

## 2011-01-23 NOTE — Letter (Signed)
Summary: [facility]  [facility]   Imported By: Lanelle Bal 05/17/2010 10:33:06  _____________________________________________________________________  External Attachment:    Type:   Image     Comment:   External Document  Appended Document: [facility] good response to lupron PSA down from 73-5.8

## 2011-01-23 NOTE — Progress Notes (Signed)
Summary: CALL BACK  Phone Note Call from Patient Call back at Home Phone [phone]   Caller: SELF Call For: [facility] Summary of Call: WOULD LIKE A CALL BACK FROM MELISSA ABOUT SOMETHING THAT THEY ARE WORKING ON TOGETHER Initial call taken by: Harlon Flor,  March 07, 2010 11:10 AM  Follow-up for Phone Call        calling in refills Follow-up by: Charlena Cross, RN, BSN,  March 07, 2010 11:31 AM

## 2011-01-23 NOTE — Letter (Signed)
Summary: [facility]  [facility]   Imported By: Lanelle Bal 01/25/2010 14:21:11  _____________________________________________________________________  External Attachment:    Type:   Image     Comment:   External Document  Appended Document: [facility] starting lupron for increasing PSA with his prostate cancer

## 2011-01-23 NOTE — Progress Notes (Signed)
----   Converted from flag ---- ---- 05/18/2010 12:47 PM, Cindee Salt MD wrote: okay will decide at his visit  ---- 05/18/2010 10:51 AM, Nathen May, MD, [facility] wrote: good am ,  Richard,  Mr Weddington has some edema when you see him  it may be worth a try of hyzaar instead of cozaar if it still is present thanks steve klein ------------------------------

## 2011-01-23 NOTE — Miscellaneous (Signed)
Summary: PT Progress Note/[facility]  PT Progress Note/[facility]   Imported By: Lanelle Bal 01/27/2010 10:12:38  _____________________________________________________________________  External Attachment:    Type:   Image     Comment:   External Document

## 2011-01-23 NOTE — Miscellaneous (Signed)
Summary: PT Order/[facility]  PT Order/[facility]   Imported By: Lanelle Bal 02/21/2010 08:48:14  _____________________________________________________________________  External Attachment:    Type:   Image     Comment:   External Document

## 2011-01-23 NOTE — Progress Notes (Signed)
Summary: RN to [facility]  Phone Note Call from Patient Call back at Home Phone [phone]   Caller: Patient Call For: RN Summary of Call: Patient would like for RN to call him. Initial call taken by: [place],  February 21, 2010 11:43 AM  Follow-up for Phone Call        appt for pacers set up.  Follow-up by: Charlena Cross, RN, BSN,  February 21, 2010 12:15 PM

## 2011-01-23 NOTE — Assessment & Plan Note (Signed)
Summary: NEP/AMD   Visit Type:  Initial Consult Primary Provider:  Cindee Salt MD  CC:  pacemaker check.  History of Present Illness: Mr. [name] is seen at the request of Dr.Letvak to establish pacemaker followup.  this was implanted in 2008 by Dr. Reyes Ivan at the request of Dr. Swaziland for complete heart block. He received a Medtronic dual-chamber device.  These records were reviewed.  He underwent an echo at that time demonstrating normal left ventricular function  The patient denies SOB, chest pain,or palpitations;  he does have mild edema  He has had no syncope or lightheadedness  His diet is salt deplete    Current Medications (verified): 1)  Pravastatin Sodium 20 Mg Tabs (Pravastatin Sodium) .... Take 1 Tablet By Mouth Once A Day 2)  Allopurinol 300 Mg  Tabs (Allopurinol) .Marland Kitchen.. 1 By Mouth Daily 3)  Adult Aspirin Ec Low Strength 81 Mg  Tbec (Aspirin) .... Once Daily 4)  Cozaar 50 Mg  Tabs (Losartan Potassium) .Marland Kitchen.. 1 Daily 5)  Acetaminophen 325 Mg Tabs (Acetaminophen) .... 2 Tabs By Mouth Up To Four Times Daily For Hip or Leg Pain 6)  Miralax  Powd (Polyethylene Glycol 3350) .Marland Kitchen.. 1 Capful With Water Daily To Prevent Constipation 7)  Loratadine 10 Mg Tabs (Loratadine) .Marland Kitchen.. 1-2 Tabs By Mouth Daily As Needed For Allergies  Allergies (verified): 1)  * Sulfa (Sulfonamides) Group  Vital Signs:  Patient profile:   77 year old male Height:      68.5 inches Weight:      175 pounds BMI:     26.32 Pulse rate:   73 / minute BP sitting:   132 / 86  (left arm) Cuff size:   regular  Vitals Entered By: Hardin Negus, RMA (May 18, 2010 10:04 AM)  Physical Exam  General:  alert and normal appearance.   Head:  heent normal   Neck:  supple without thyromegaly Chest Wall:  mild kyphosis without scoliosis Lungs:  clear Heart:  regular rate and rhythm with an S4 and a 2/6 systolic murmur at the right upper sternal border Abdomen:  soft nontender with active bowel sounds Msk:   Back normal, normal gait. Muscle strength and tone normal. Pulses:  intact distal pulses Extremities:  no clubbing or cyanosis 1+ peripheral edema Neurologic:  grossly  normal with intact motor and sensory function alert and oriented Skin:  warm and dry Cervical Nodes:  no adenopathy Psych:  engaging and alert   PPM Specifications Following MD:  Sherryl Manges, MD     PPM Vendor:  Medtronic     PPM Model Number:  ADDRL1     PPM Serial Number:  [id number] PPM DOI:  10/09/2007     PPM Implanting MD:  NOT IMPLANTED BY [place]  Lead 1    Location: RA     DOI: 10/09/2007     Model #: 6433     Serial #: [id number]     Status: active Lead 2    Location: RV     DOI: 10/09/2007     Model #: 0630     Serial #: [id number]     Status: active  Magnet Response Rate:  BOL 85 ERI  65  Indications:  CHB   PPM Follow Up Remote Check?  No Battery Voltage:  2.78 V     Battery Est. Longevity:  8 years     Pacer Dependent:  Yes       PPM Device Measurements Atrium  Amplitude:  1.4 mV, Impedance: 491 ohms, Threshold: 0.75 V at 0.4 msec Right Ventricle  Amplitude: 11.20 mV, Impedance: 545 ohms, Threshold: 0.625 V at 0.4 msec  Episodes MS Episodes:  48     Percent Mode Switch:  <0.1%     Coumadin:  No Ventricular High Rate:  0     Atrial Pacing:  27.4     Ventricular Pacing:  100  Parameters Mode:  DDDR     Lower Rate Limit:  60     Upper Rate Limit:  130 Paced AV Delay:  150     Sensed AV Delay:  120 Next Cardiology Appt Due:  10/24/2010 Tech Comments:  No parameter changes.  Device function normal.  No Carelink @ this time.  ROV 6 months [place] [facility]. Altha Harm, LPN  May 18, 2010 10:33 AM   Impression & Recommendations:  Problem # 1:  AV BLOCK, COMPLETE (ICD-426.0) the patient is stable status post pacemaker implantation for complete heart block; His updated medication list for this problem includes:    Adult Aspirin Ec Low Strength 81 Mg Tbec (Aspirin) ..... Once daily  Problem # 2:   PACEMAKER, MDT DDD (ICD-V45.01) Device parameters and data were reviewed and no changes were made  he is 100% ventricularly paced and has good chronotropic competence  Problem # 3:  EDEMA (ICD-782.3) the patient has mild peripheral edema. It might be of value to switch his Cozaar to Hyzaar. I will defer this to Dr. Orlean Bradford  Patient Instructions: 1)  Your physician recommends that you schedule a follow-up appointment in: 6 months with pacer and Dr Graciela Husbands

## 2011-01-25 NOTE — Miscellaneous (Signed)
Summary: Hyzaar Rx  Clinical Lists Changes  Medications: Rx of HYZAAR 50-12.5 MG TABS (LOSARTAN POTASSIUM-HCTZ) Take one tablet by mouth once daily.;  #30 x 6;  Signed;  Entered by: Lanny Hurst RN;  Authorized by: Nathen May, MD, [facility];  Method used: Electronically to [facility] [address], [place], [place], [place]  [address], Ph: [phone], Fax: [phone]    Prescriptions: HYZAAR 50-12.5 MG TABS (LOSARTAN POTASSIUM-HCTZ) Take one tablet by mouth once daily.  #30 x 6   Entered by:   Lanny Hurst RN   Authorized by:   Nathen May, MD, [facility]   Signed by:   Lanny Hurst RN on 01/15/2011   Method used:   Electronically to        [facility] (retail)       [address]       [place], [place]  [address]       Ph: [phone]       Fax: [phone]   RxID:   [id number]   Pt called stating he lost prescription after office visit, he now knows what pharmacy he uses and we will e-rx hyzaar.

## 2011-01-25 NOTE — Assessment & Plan Note (Signed)
Summary: PACER CHECK/AMD   Visit Type:  Follow-up Primary Provider:  Dr. Burnett Sheng  CC:  "doing well" denies chest pain and SOB.Aaron Cervantes  History of Present Illness: [name] is seen at the request of Dr.Letvak to establish pacemaker followup.  this was implanted in 2008 by Dr. Reyes Ivan at the request of Dr. Swaziland for complete heart block. He received a Medtronic dual-chamber device.  These records were reviewed.  He underwent an echo at that time demonstrating normal left ventricular function  The patient denies SOB, chest pain,or palpitations;  he does have mild edema  He has had no syncope or lightheadedness  His diet is salt deplete  Current Medications (verified): 1)  Pravastatin Sodium 20 Mg Tabs (Pravastatin Sodium) .... Take 1 Tablet By Mouth Once A Day 2)  Allopurinol 300 Mg  Tabs (Allopurinol) .Aaron Cervantes.. 1 By Mouth Daily 3)  Adult Aspirin Ec Low Strength 81 Mg  Tbec (Aspirin) .... Once Daily 4)  Cozaar 50 Mg  Tabs (Losartan Potassium) .Aaron Cervantes.. 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       Echo (-) Carotids (-) 11/08 Pacemaker 6/09  Cataract right eye  Family History: Last updated: 07-06-07 Father:Died 47, old age  Mother: Died 60, Rheumatic fever complications Siblings: None GP's:  Grandparent died of pneumonia DM:  Dad (late in life) No prostate or colon cancer  Social History: Last updated: 11/23/2008 Never Smoked Alcohol use-yes Drug use-no Regular exercise-yes, occasional gym, rare golf, swims Marital Status:  Married, 2nd Children: 3, [place] and [place] Occupation: Forensic scientist Enjoys bridge and poker  Risk Factors: Exercise: yes (July 06, 2007)  Risk Factors: Smoking Status: never (Jul 06, 2007)  Vital Signs:  Patient profile:   77 year old male Height:      68.5 inches Weight:      183.25 pounds BMI:     27.56 Pulse rate:   76 / minute BP sitting:   142 / 80  (left arm) Cuff size:   regular  Vitals Entered By: Lysbeth Galas CMA (January 15, 2011 11:29 AM)   Physical Exam  General:  The patient was alert and oriented in no acute distress. HEENT Normal.  Neck veins were flat, carotids were brisk.  Lungs were clear.  Heart sounds were regular without murmurs or gallops.  Abdomen was soft with active bowel sounds. There is no clubbing cyanosis ; 1+ edema Skin Warm and dry    PPM Specifications Following MD:  Sherryl Manges, MD     PPM Vendor:  Medtronic     PPM Model Number:  ADDRL1     PPM Serial Number:  [id number] PPM DOI:  10/09/2007     PPM Implanting MD:  NOT IMPLANTED BY [place]  Lead 1    Location: RA     DOI: 10/09/2007     Model #: [id number]     Serial #: [id number]     Status: active Lead 2    Location: RV     DOI: 10/09/2007     Model #: 5638     Serial #: [id number]     Status: active  Magnet Response Rate:  BOL 85 ERI  65  Indications:  CHB   PPM Follow Up Remote Check?  No Battery Voltage:  2.78 V     Battery Est. Longevity:  7.5 years     Pacer Dependent:  Yes       PPM Device Measurements Atrium  Amplitude: 1.4 mV, Impedance: 444 ohms, Threshold: 0.75 V at 0.4 msec Right Ventricle  Amplitude: 11.20 mV, Impedance: 569 ohms, Threshold: 0.625 V at 0.4 msec  Episodes MS Episodes:  9     Percent Mode Switch:  <0.1%     Coumadin:  No Ventricular High Rate:  1     Atrial Pacing:  32%     Ventricular Pacing:  100%  Parameters Mode:  DDDR     Lower Rate Limit:  60     Upper Rate Limit:  130 Paced AV Delay:  150     Sensed AV Delay:  120 Next Cardiology Appt  Due:  06/24/2011 Tech Comments:  No parameter changes.  Device function normal.  No Carelink @ this time.  ROV 6 months [place] [facility].   Altha Harm, LPN  January 15, 2011 11:38 AM   Impression & Recommendations:  Problem # 1:  EDEMA (ICD-782.3) we will take his Cozaar to Hyzaar. We'll check his metabolic profile in 2-3 weeks' time  Problem # 2:  PACEMAKER, MDT DDD (ICD-V45.01) Device parameters and data were reviewed and no changes were made  Problem # 3:  AV BLOCK, COMPLETE (ICD-426.0) stable within intrinsic escape rhythm His updated medication list for this problem includes:    Adult Aspirin Ec Low Strength 81 Mg Tbec (Aspirin) ..... Once daily  Patient Instructions: 1)  TO BE SCHEDULED IN 3 WEEKS: Your physician recommends that you return for lab work in: BMP 2)  Your physician has recommended you make the following change in your medication: STOP current Cozaar. START Hyzaar 50/12.5mg  once daily. Prescriptions: HYZAAR 50-12.5 MG TABS (LOSARTAN POTASSIUM-HCTZ) Take one tablet by mouth once daily.  #30 x 6   Entered by:   Lanny Hurst RN   Authorized by:   Nathen May, MD, [facility]   Signed by:   Lanny Hurst RN on 01/15/2011   Method used:   Print then Give to Patient   RxID:   [id number]

## 2011-01-25 NOTE — Letter (Signed)
Summary: Device-Delinquent Check  [facility], Main Office  [address]   [place], [place] [address]   Phone: [phone]  Fax: [phone]     January 08, 2011 MRN: [id number]   Aaron Cervantes [address] [place], [place]  [address]   Dear [name],  According to our records, you have not had your implanted device checked in the recommended period of time.  We are unable to determine appropriate device function without checking your device on a regular basis.  Please call our office to schedule an appointment as soon as possible, with Dr. Graciela Husbands in [place].   If you are having your device checked by another physician, please call us so that we may update our records.  Thank you,  Altha Harm, LPN  January 08, 2011 12:49 PM  [facility]

## 2011-01-25 NOTE — Letter (Signed)
Summary: E-Mail from [facility] Dr.Letvak  E-Mail from [facility] Dr.Letvak   Imported By: Beau Fanny 12/15/2010 09:29:36  _____________________________________________________________________  External Attachment:    Type:   Image     Comment:   External Document

## 2011-02-05 ENCOUNTER — Other Ambulatory Visit: Payer: Medicare Other

## 2011-02-06 ENCOUNTER — Other Ambulatory Visit (INDEPENDENT_AMBULATORY_CARE_PROVIDER_SITE_OTHER): Payer: Medicare Other

## 2011-02-06 ENCOUNTER — Encounter: Payer: Self-pay | Admitting: Cardiovascular Disease

## 2011-02-06 DIAGNOSIS — R609 Edema, unspecified: Secondary | ICD-10-CM

## 2011-02-06 DIAGNOSIS — R5381 Other malaise: Secondary | ICD-10-CM

## 2011-02-06 DIAGNOSIS — R5383 Other fatigue: Secondary | ICD-10-CM

## 2011-02-07 LAB — CONVERTED CEMR LAB
BUN: 29 mg/dL — ABNORMAL HIGH
CO2: 24 meq/L
Calcium: 9.3 mg/dL
Chloride: 105 meq/L
Creatinine, Ser: 1.24 mg/dL
Glucose, Bld: 98 mg/dL
Potassium: 4.8 meq/L
Sodium: 141 meq/L

## 2011-04-28 ENCOUNTER — Emergency Department: Payer: Self-pay | Admitting: Unknown Physician Specialty

## 2011-05-08 NOTE — Assessment & Plan Note (Signed)
[facility]                                 ON-CALL NOTE   Aaron Cervantes, Aaron Cervantes                      MRN:          [id number]  DATE:05/14/2009                            DOB:          1917-04-07    Time 7:58amHe sees Dr. Alphonsus Sias.  Phone#  [phone]  The patient developed an ear ache last night and he asks me to call in  antibiotic ear drops.  He has no other symptoms.  I explained to him  that we do not call in antibiotics over the telephone site unseen, I  offered to see him in our Saturday clinic later this morning, but he  refused.  I told him another option would be to see a local urgent care  clinic.  He said he would wait and talk to Dr. Alphonsus Sias on Monday.     Tera Mater. Clent Ridges, MD  Electronically Signed    SAF/MedQ  DD: 05/14/2009  DT: 05/15/2009  Job #: [id number]

## 2011-05-08 NOTE — Op Note (Signed)
NAME:  ALEXYS, GASSETT NO.:  [id number]   MEDICAL RECORD NO.:  [id number]          PATIENT TYPE:  OIB   LOCATION:  3731                         FACILITY:  [facility]   PHYSICIAN:  Elmore Guise., M.D.DATE OF BIRTH:  1917-01-01   DATE OF PROCEDURE:  10/21/2007  DATE OF DISCHARGE:                               OPERATIVE REPORT   PROCEDURE:  Pacemaker lead revision.   INDICATIONS FOR PROCEDURE:  Ventricular lead dislodgement.   PROCEDURE DESCRIPTION:  The patient was brought to the cardiac  catheterization lab after appropriate informed consent.  He was prepped  and draped in sterile fashion.  Approximately 20 mL of 1% lidocaine was  used for local anesthesia.  A 2-inch incision was made over his old  device in the left deltopectoral groove.  The tissue was opened and the  old pacemaker was explanted.  The atrial and ventricular leads were then  identified and checked.  The atrial lead is a Medtronic active fixation,  (Serial number [id number] PJN [id number])   The following measurements were obtained with the atrial lead:  P-waves  measured 3.9 mV with impedance of 567 ohms, threshold of 1.1 mV at 0.5  msec; current of 2.0 mA.   The ventricular lead was then identified.  The screw was then brought  back into the lead.  Multiple attempts were made to advance the lead  with different stylets, and because of his tortuosity we were unable to  advance them.  A single stick was made in the left axillary vein.  A 7-  [place] safety sheath was then placed.  The lead was removed and placed  back through the sheath to the RV apex.  The following measurements were  obtained:  R-waves measured 10.8 mV with impedance of 718 ohms;  threshold was 0.3 mV at 0.5 msec; current of 0.9 mA.   The atrial and ventricular leads were then sewn into the pocket.  The  pocket was copiously irrigated with kanamycin solution.  The atrial and  ventricular leads were placed in the appropriate  portion on a Medtronic  Adapta generator.  The generator was sewn into the pocket.  Pocket was  then closed with 3 continuous layers with 2-0 followed by 2-0 followed  by 4-0 Vicryl suture.  Steri-Strips were applied.  The patient tolerated  the procedure well with no apparent complications.      Elmore Guise., M.D.  Electronically Signed     TWK/MEDQ  D:  10/21/2007  T:  10/21/2007  Job:  [id number]   cc:   Peter M. Swaziland, M.D.

## 2011-05-08 NOTE — Discharge Summary (Signed)
NAME:  Aaron Cervantes, Aaron Cervantes NO.:  [id number]   MEDICAL RECORD NO.:  [id number]          PATIENT TYPE:  OIB   LOCATION:  2899                         FACILITY:  [facility]   PHYSICIAN:  Elmore Guise., M.D.DATE OF BIRTH:  June 25, 1917   DATE OF ADMISSION:  10/09/2007  DATE OF DISCHARGE:                               DISCHARGE SUMMARY   INDICATION:  Complete heart block.  The patient for dual-chamber  permanent pacemaker implant.   HISTORY OF PRESENT ILLNESS:  Mr. Rumple is a very pleasant 77 year old  white male, past medical history of dyslipidemia, gout, BPH and  hypertension who presented for new patient evaluation.  The patient  reports he went to be evaluated for cataract removal of his left eye and  was found to be bradycardic.  EKG at that time showed complete heart  block.  He is now referred for further evaluation.  The patient denies  any exertional chest pain or shortness of breath.  He does report  sometimes he gets a little tired.  He does state that his exertional  tolerance may be a little lower.  He denies any recent fever or cough.  No orthopnea or PND.  No significant lower extremity edema.   REVIEW OF SYSTEMS:  Otherwise negative.  He denies syncope.   CURRENT MEDICATIONS:  1. Allopurinol 300 mg daily.  2. Cozaar 50 mg daily.  3. Pravastatin 20 mg daily.  4. Avodart 2 times weekly.  5. Aspirin 81 mg daily.   ALLERGIES:  QUESTIONABLE HISTORY OF ALLERGY TO SULFA.Marland Kitchen   FAMILY HISTORY:  Positive for rheumatic heart disease in his mother.  His father died from old age.   SOCIAL HISTORY:  He is married.  He is retired from Set [employer].  Tries to walk 3 times per week at the retirement center.  No tobacco.  No significant alcohol.  Occasional glass of coffee.   PAST SURGICAL HISTORY:  Include bilateral hernia repair 4 years ago.   PHYSICAL EXAMINATION:  VITAL SIGNS:  His weight is 190 pounds, blood  pressure 148/80. His heart rate is 56  and regular.  GENERAL:  He is a very pleasant elderly white male, alert and oriented  x4.  No acute distress.  NECK:  He has no JVD.  He does have soft radiation of an AS murmur to  bilateral carotids.  LUNGS:  Clear.  HEART:  Regular with 2/6 systolic ejection murmur noted.  ABDOMEN:  Soft, nontender, nondistended.  EXTREMITIES:  Warm with 2+ pulses and no significant edema.   His EKG shows sinus rhythm with complete heart block.   IMPRESSION:  1. Complete heart block.  2. History of hypertension.   PLAN:  I discussed permanent pacemaker implant with him at length.  This  will be scheduled for tomorrow.  He understands the risk and would like  to proceed.  His wife had pacemaker change-out done approximately 1  month ago, so he understands the procedure as discussed.  All his  questions were answered.      Elmore Guise., M.D.  Electronically Signed  TWK/MEDQ  D:  10/08/2007  T:  10/09/2007  Job:  [id number]

## 2011-05-08 NOTE — Discharge Summary (Signed)
NAME:  GARNETT, NUNZIATA NO.:  [id number]   MEDICAL RECORD NO.:  [id number]          PATIENT TYPE:  OIB   LOCATION:  3731                         FACILITY:  [facility]   PHYSICIAN:  Elmore Guise., M.D.DATE OF BIRTH:  04-28-1917   DATE OF ADMISSION:  10/21/2007  DATE OF DISCHARGE:  10/22/2007                               DISCHARGE SUMMARY   DISCHARGE DIAGNOSES:  1. History of complete heart block status post pacemaker lead revision      secondary to ventricular lead dislodgement.  2. Hypertension.  3. Gout.  4. Dyslipidemia.   HISTORY OF PRESENT ILLNESS:  Mr. Venable is a very pleasant 77 year old  white male who underwent dual-chamber permanent pacemaker implant last  week.  On his wound check evaluation he was noted to have difficulty  pacing his ventricular lead.  Chest x-ray was performed the following  day which showed dislodgement.  He was admitted for lead revision.   HOSPITAL COURSE:  The patient's hospital course was uncomplicated.  He  underwent lead revision on October 21, 2007.  He tolerated this  procedure well.  His atrial lead did not have to be moved.  I did give  him extra slack in his atrial lead to hopefully provide him with enough  redundancy so that he will have no difficulty with this lead in the  future.  His ventricular lead actually had to be withdrawn and then  replaced with a separate stick.  His ventricular lead was placed in the  RV apex.  He tolerated this procedure well.  No apparent complications.  The following day his chest x-ray showed good lead placement of both his  atrial and ventricular leads.  He had no pneumothorax.  His device was  interrogated and functioned appropriately.  His P waves measured 4-5 mV  but threshold of 1.25 volts at 0.4 milliseconds.  Impedance was stable.  His R waves measure greater than 15.8 volts with threshold of 0.25 volts  at 0.4 milliseconds.  All these numbers are stable from his revision  earlier the prior day.  He will receive his last dose of IV antibiotics  at 10 a.m. today.  Following his last dose of antibiotics he will be  discharged home to continue pacemaker restrictions for the next couple  weeks.  He will wear his sling for 48 hours to help his leads to mature.  Following that we went over in detail restrictions with his left arm on  keeping his arm under shoulder level for the next two weeks and then  slowly increase in his activity as tolerated.  He is not to lift  anything heavy with his left arm for the next two weeks.  He is to  Betadine his Steri-Strips daily for the next three days and use Tylenol  as needed for pain.  He is to keep the area clean and dry.   DISCHARGE MEDICATIONS:  1. Allopurinol 300 mg daily.  2. Cozaar 50 mg daily.  3. Pravachol 20 mg daily.  4. Avodart two times per week.   FOLLOWUP:  His follow-up appointment will be with  Dr. Reyes Ivan at  [facility] in one week.  He is to call the office if he has  any problems.  Pacemaker restriction sheet was given to him and  emphasized all restrictions.      Elmore Guise., M.D.  Electronically Signed     TWK/MEDQ  D:  10/22/2007  T:  10/22/2007  Job:  [id number]   cc:   Peter M. [place], M.D.

## 2011-05-08 NOTE — Op Note (Signed)
NAME:  Aaron Cervantes, Aaron Cervantes NO.:  [id number]   MEDICAL RECORD NO.:  [id number]          PATIENT TYPE:  OIB   LOCATION:  2807                         FACILITY:  [facility]   PHYSICIAN:  Elmore Guise., M.D.DATE OF BIRTH:  [birth year]   DATE OF PROCEDURE:  10/09/2007  DATE OF DISCHARGE:                               OPERATIVE REPORT   INDICATIONS FOR PROCEDURE:  Complete heart block.   PROCEDURE DESCRIPTION:  The patient was brought to the cardiac cath lab  after appropriate informed consent.  He was prepped and draped in a  sterile fashion.  A 2-inche incision was made in the left deltopectoral  groove.  A subcutaneous pocket was made with blunt and Bovie dissection.  The venogram was then performed, showing the root of the left  axillary/subclavian vein.  The left axillary vein was then accessed in  two separate sticks under fluoroscopic guidance.  Two 7-French safety  sheaths were placed over the retained wires.  The ventricular lead was  then placed.  The ventricular lead is an active fixation Medtronic 5076-  58 cm, serial number [id number].  The following measurements were  obtained:  Impedance of 1186 ohms, R-waves measured 25.5 mV, threshold  of 1.1 V at 0.5 msec.  Current is 1.3 mA.  A 10-volt check was negative.  The atrial lead was then placed.  The atrial lead is a Medtronic active  fixation 5076-52 cm, serial number [id number].  The following  measurements were obtained:  Impedance of 675 ohms, P-waves measured 6.7  mV, with threshold of 1.3 V at 0.5 msec.  Current is 2.1 mA.  A 10-volt  check was negative.  The pocket was then irrigated with kanamycin  solution.  The atrial and ventricular leads were sewn into the floor of  the pocket.  The atrial and ventricular leads were then identified and  placed in the appropriate portion of a Medtronic Adapta L DDDRL1, serial  number [id number] H generator.  The generator was sewn into the pocket.  The pocket was then  closed with three continuous layers with 2-0,  followed by 2-0, followed by 4-0 Vicryl suture.  The patient tolerated  the procedure well and was transferred from the cardiac catheterization  lab in stable condition.      Elmore Guise., M.D.  Electronically Signed     TWK/MEDQ  D:  10/09/2007  T:  10/10/2007  Job:  [id number]   cc:   Peter M. Sw[place]iland, M.D.

## 2011-05-08 NOTE — Discharge Summary (Signed)
NAME:  Aaron Cervantes, KEEP NO.:  [id number]   MEDICAL RECORD NO.:  [id number]          PATIENT TYPE:  OIB   LOCATION:  3732                         FACILITY:  [facility]   PHYSICIAN:  Elmore Guise., M.D.DATE OF BIRTH:  Aug 05, 1917   DATE OF ADMISSION:  10/09/2007  DATE OF DISCHARGE:  10/10/2007                               DISCHARGE SUMMARY   DISCHARGE DIAGNOSES:  1. History of third-degree AV block status post dual-chamber permanent      pacemaker implant.  2. Hypertension.  3. Gout.  4. Abnormal chest x-ray with bilateral lung lesions, to complete      workup with CT scan as an outpatient.   HISTORY OF PRESENT ILLNESS:  Mr. Teegarden is a very pleasant 77 year old  white male who was found to be in complete heart block.  He was referred  for pacemaker implant.   HOSPITAL COURSE:  The patient's hospital course was uncomplicated.  He  underwent dual-chamber permanent pacemaker implant on October 09, 2007.  He tolerated the procedure well.  Postprocedure course was  uncomplicated.  His device was interrogated and functioning  appropriately.  His site had no swelling, no erythema.  He has been up  and ambulatory without any problems.  He will be discharged home today  to continue the following medications.   DISCHARGE MEDICATIONS:  1. Allopurinol.  2. Cozaar 50 mg daily.  3. Pravastatin 20 mg daily.  4. Avodart two times per week.  5. Aspirin 81 mg daily.   ACTIVITY:  He will have post pacemaker restrictions.  He is to increase  his activity slowly.   FOLLOW UP:  He will have follow-up office visit in 1 week with Dr.  Reyes Ivan for wound check.  At that time he will be scheduled for a CT scan  to evaluate his lung lesions noticed on chest x-ray.  He is to call the  office if he has any questions or concerns.      Elmore Guise., M.D.  Electronically Signed     TWK/MEDQ  D:  10/10/2007  T:  10/12/2007  Job:  [id number]   cc:   Peter M. [place], M.D.

## 2011-07-17 ENCOUNTER — Encounter: Payer: Self-pay | Admitting: Cardiovascular Disease

## 2011-08-03 ENCOUNTER — Encounter: Payer: Self-pay | Admitting: *Deleted

## 2011-08-22 ENCOUNTER — Ambulatory Visit (INDEPENDENT_AMBULATORY_CARE_PROVIDER_SITE_OTHER): Payer: Medicare Other | Admitting: *Deleted

## 2011-08-22 DIAGNOSIS — I442 Atrioventricular block, complete: Secondary | ICD-10-CM

## 2011-08-22 LAB — PACEMAKER DEVICE OBSERVATION
BAMS-0001: 175 {beats}/min
BATTERY VOLTAGE: 2.78 V
RV LEAD AMPLITUDE: 11.2 mv
RV LEAD IMPEDENCE PM: 541 Ohm
RV LEAD THRESHOLD: 0.5 V

## 2011-08-22 NOTE — Progress Notes (Signed)
PPM check 

## 2011-09-20 ENCOUNTER — Ambulatory Visit: Payer: Self-pay | Admitting: Family Medicine

## 2011-11-27 ENCOUNTER — Encounter: Payer: Self-pay | Admitting: Cardiovascular Disease

## 2011-12-04 ENCOUNTER — Ambulatory Visit (INDEPENDENT_AMBULATORY_CARE_PROVIDER_SITE_OTHER): Payer: Medicare Other | Admitting: Cardiovascular Disease

## 2011-12-04 ENCOUNTER — Encounter: Payer: Self-pay | Admitting: Cardiovascular Disease

## 2011-12-04 DIAGNOSIS — I10 Essential (primary) hypertension: Secondary | ICD-10-CM

## 2011-12-04 DIAGNOSIS — E785 Hyperlipidemia, unspecified: Secondary | ICD-10-CM

## 2011-12-04 DIAGNOSIS — I350 Nonrheumatic aortic (valve) stenosis: Secondary | ICD-10-CM

## 2011-12-04 DIAGNOSIS — I442 Atrioventricular block, complete: Secondary | ICD-10-CM

## 2011-12-04 DIAGNOSIS — I359 Nonrheumatic aortic valve disorder, unspecified: Secondary | ICD-10-CM

## 2011-12-04 NOTE — Patient Instructions (Signed)
You are doing well. No medication changes were made.  Please call us if you have new issues that need to be addressed before your next appt.  The office will contact you for a follow up Appt. In 12 months  

## 2011-12-04 NOTE — Assessment & Plan Note (Signed)
Suspect mild aortic valve stenosis by clinical exam. No symptoms at this time.

## 2011-12-04 NOTE — Assessment & Plan Note (Signed)
Blood pressure is well controlled on today's visit. No changes made to the medications. 

## 2011-12-04 NOTE — Progress Notes (Signed)
   Patient ID: Aaron Cervantes, male    DOB: 1917-03-17, 77 y.o.   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 He has no complaints, walks one mild per day with no chest pain or SOB. He does go to the gym at [facility] 3 times per week and does the exercise class for 40 minutes. He does the cooking, groceries and cleaning. His wife is in the Alzheimer's unit at [facility]. He is being treated for a urinary tract infection and has several days of Cipro remaining   ECHO: 12/2006: essentially a normal study, EF 65%   No known CAD or PVD  EKG shows paced rhythm at 71 beats per minute   Outpatient Encounter Prescriptions as of 12/04/2011  Medication Sig Dispense Refill  . allopurinol (ZYLOPRIM) 300 MG tablet Take 300 mg by mouth daily.        Marland Kitchen aspirin 81 MG EC tablet Take 81 mg by mouth daily.        . bicalutamide (CASODEX) 50 MG tablet Take 50 mg by mouth daily.        . ciprofloxacin (CIPRO) 500 MG tablet Take 500 mg by mouth 2 (two) times daily.        Marland Kitchen losartan-hydrochlorothiazide (HYZAAR) 50-12.5 MG per tablet Take 1 tablet by mouth daily.        . pravastatin (PRAVACHOL) 20 MG tablet Take 20 mg by mouth daily.           Review of Systems  Constitutional: Negative.   HENT: Negative.   Eyes: Negative.   Respiratory: Negative.   Cardiovascular: Negative.   Gastrointestinal: Negative.   Musculoskeletal: Negative.   Skin: Negative.   Neurological: Negative.   Hematological: Negative.   Psychiatric/Behavioral: Negative.   All other systems reviewed and are negative.    BP 110/70  Pulse 72  Ht 5' 9.5" (1.765 m)  Wt 178 lb (80.74 kg)  BMI 25.91 kg/m2  Physical Exam  Nursing note and vitals reviewed. Constitutional: He is oriented to person, place, and time. He appears well-developed  and well-nourished.  HENT:  Head: Normocephalic.  Nose: Nose normal.  Mouth/Throat: Oropharynx is clear and moist.  Eyes: Conjunctivae are normal. Pupils are equal, round, and reactive to light.  Neck: Normal range of motion. Neck supple. No JVD present.  Cardiovascular: Normal rate, regular rhythm, S1 normal, S2 normal and intact distal pulses.  Exam reveals no gallop and no friction rub.   Murmur heard.  Crescendo systolic murmur is present with a grade of 2/6  Pulmonary/Chest: Effort normal and breath sounds normal. No respiratory distress. He has no wheezes. He has no rales. He exhibits no tenderness.  Abdominal: Soft. Bowel sounds are normal. He exhibits no distension. There is no tenderness.  Musculoskeletal: Normal range of motion. He exhibits no edema and no tenderness.  Lymphadenopathy:    He has no cervical adenopathy.  Neurological: He is alert and oriented to person, place, and time. Coordination normal.  Skin: Skin is warm and dry. No rash noted. No erythema.  Psychiatric: He has a normal mood and affect. His behavior is normal. Judgment and thought content normal.           Assessment and Plan

## 2011-12-04 NOTE — Assessment & Plan Note (Signed)
Status post pacemaker, followed by Corinda Gubler EP

## 2011-12-04 NOTE — Assessment & Plan Note (Signed)
We have encouraged him to stay on his pravastatin. He has no known coronary or peripheral vascular disease.

## 2011-12-21 ENCOUNTER — Encounter: Payer: Self-pay | Admitting: Internal Medicine

## 2012-02-29 ENCOUNTER — Encounter: Payer: Self-pay | Admitting: Internal Medicine

## 2012-02-29 ENCOUNTER — Ambulatory Visit (INDEPENDENT_AMBULATORY_CARE_PROVIDER_SITE_OTHER): Payer: Medicare Other | Admitting: Internal Medicine

## 2012-02-29 DIAGNOSIS — I442 Atrioventricular block, complete: Secondary | ICD-10-CM

## 2012-02-29 DIAGNOSIS — Z950 Presence of cardiac pacemaker: Secondary | ICD-10-CM

## 2012-02-29 DIAGNOSIS — I10 Essential (primary) hypertension: Secondary | ICD-10-CM

## 2012-02-29 LAB — PACEMAKER DEVICE OBSERVATION
AL AMPLITUDE: 0.7 mv
AL THRESHOLD: 0.875 V
BAMS-0001: 175 {beats}/min
RV LEAD IMPEDENCE PM: 578 Ohm
RV LEAD THRESHOLD: 0.5 V

## 2012-02-29 NOTE — Patient Instructions (Signed)
No changes. Follow up with Dr. Graciela Husbands in one year.

## 2012-02-29 NOTE — Assessment & Plan Note (Signed)
The patient's device was interrogated.  The information was reviewed. No changes were made in the programming.    

## 2012-02-29 NOTE — Assessment & Plan Note (Signed)
Stable post pacing 

## 2012-02-29 NOTE — Assessment & Plan Note (Signed)
Poorly controlled  May benefit from added diurtetic  Will defer to pcp

## 2012-02-29 NOTE — Progress Notes (Signed)
  HPI  Aaron Cervantes is a 78 y.o. male Seen in pacemaker followup. this was implanted in 2008 by Dr. Reyes Ivan at the request of Dr. Swaziland for complete heart block. He received a Medtronic dual-chamber device.     He underwent an echo at that time demonstrating normal left ventricular function    The patient denies SOB, chest pain,or palpitations; he does have mild edema  He has had no syncope or lightheadedness     Past Medical History  Diagnosis Date  . Prostate cancer     Dr. Achilles Dunk  . Gout   . HTN (hypertension)   . TIA (transient ischemic attack) 1/08  . HLD (hyperlipidemia)   . Complete heart block     Dr. Swaziland  . Aortic stenosis   . AR (allergic rhinitis)   . Osteoarthritis     Past Surgical History  Procedure Date  . Right and left ih 2003  . 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 Echo and carotids 1/08    neg  . Pacemaker insertion 11/08    Medtronic   . Cataract rt. eye 6/09    Current Outpatient Prescriptions  Medication Sig Dispense Refill  . allopurinol (ZYLOPRIM) 300 MG tablet Take 300 mg by mouth daily.        Marland Kitchen aspirin 81 MG EC tablet Take 81 mg by mouth daily.        . bicalutamide (CASODEX) 50 MG tablet Take 50 mg by mouth daily.        . bimatoprost (LUMIGAN) 0.03 % ophthalmic solution Place 1 drop into the left eye every morning.      Marland Kitchen losartan (COZAAR) 50 MG tablet Take 1 tablet by mouth Daily.      . pravastatin (PRAVACHOL) 20 MG tablet Take 20 mg by mouth daily.          Allergies  Allergen Reactions  . Sulfonamide Derivatives     REACTION: unspecified    Review of Systems negative except from HPI and PMH  Physical Exam BP 151/89  Pulse 66  Ht 5' 9.5" (1.765 m)  Wt 180 lb (81.647 kg)  BMI 26.20 kg/m2 Well developed and well nourished in no acute distress HENT normal E scleral and icterus clear Neck Supple JVP flat; Clear to ausculation Regular rate and rhythm, no murmurs gallops or rub Soft with active bowel  sounds No clubbing cyanosis Trace Edema Alert and oriented, grossly normal motor and sensory function Skin Warm and Dry   Assessment and  Plan

## 2012-08-08 ENCOUNTER — Encounter: Payer: Self-pay | Admitting: Internal Medicine

## 2012-08-14 ENCOUNTER — Encounter: Payer: Self-pay | Admitting: Internal Medicine

## 2012-08-14 ENCOUNTER — Non-Acute Institutional Stay: Payer: Medicare Other | Admitting: Internal Medicine

## 2012-08-14 VITALS — BP 120/78 | HR 62 | Temp 97.2°F | Resp 20 | Wt 182.0 lb

## 2012-08-14 DIAGNOSIS — I359 Nonrheumatic aortic valve disorder, unspecified: Secondary | ICD-10-CM

## 2012-08-14 DIAGNOSIS — I10 Essential (primary) hypertension: Secondary | ICD-10-CM

## 2012-08-14 DIAGNOSIS — E785 Hyperlipidemia, unspecified: Secondary | ICD-10-CM

## 2012-08-14 DIAGNOSIS — M109 Gout, unspecified: Secondary | ICD-10-CM

## 2012-08-14 DIAGNOSIS — R609 Edema, unspecified: Secondary | ICD-10-CM

## 2012-08-14 DIAGNOSIS — C61 Malignant neoplasm of prostate: Secondary | ICD-10-CM

## 2012-08-14 NOTE — Assessment & Plan Note (Signed)
Trace only  No Rx for this at this point

## 2012-08-14 NOTE — Assessment & Plan Note (Signed)
Quiet on the allopurinol 

## 2012-08-14 NOTE — Assessment & Plan Note (Signed)
Dr Achilles Dunk still follows and gives the Rx

## 2012-08-14 NOTE — Progress Notes (Signed)
Subjective:    Patient ID: Aaron Cervantes, male    DOB: 20-Aug-1917, 78 y.o.   MRN: [id number]  HPI Establishing here  I had taken care of him years ago Wife is in Memory Care unit Recently moved to assisted living ---- 3-4 months ago  Follows with cardiology Pacer inserted 6-7 years ago for complete AV block Still with adequate battery life  History of aortic stenosis No chest pain No dizziness or syncope Still exercises regularly and reasonable stamina though "I have felt my age more since moving over here"  Has gout Has been quiet since being on allopurinol  Prostate cancer some years ago Still gets lupron (casodex listed) from Dr Achilles Dunk ~ every 6 months This has been controlled apparently  On the statin still Wondering about stopping any meds Did have TIA many years ago---10 seconds only  Current Outpatient Prescriptions on File Prior to Visit  Medication Sig Dispense Refill  . allopurinol (ZYLOPRIM) 300 MG tablet Take 300 mg by mouth daily.        Marland Kitchen aspirin 81 MG EC tablet Take 81 mg by mouth daily.        . bicalutamide (CASODEX) 50 MG tablet Take 50 mg by mouth daily.        . bimatoprost (LUMIGAN) 0.03 % ophthalmic solution Place 1 drop into the left eye every morning.      . bisacodyl (DULCOLAX) 5 MG EC tablet Take 5 mg by mouth daily as needed.      Marland Kitchen losartan (COZAAR) 50 MG tablet Take 1 tablet by mouth Daily.      . polyethylene glycol (MIRALAX / GLYCOLAX) packet Take 17 g by mouth daily.      . pravastatin (PRAVACHOL) 20 MG tablet Take 20 mg by mouth daily.        Marland Kitchen DISCONTD: losartan-hydrochlorothiazide (HYZAAR) 50-12.5 MG per tablet Take 1 tablet by mouth daily.          Allergies  Allergen Reactions  . Sulfonamide Derivatives     REACTION: unspecified    Past Medical History  Diagnosis Date  . Gout   . HTN (hypertension)   . TIA (transient ischemic attack) 1/08  . HLD (hyperlipidemia)   . Complete heart block     Dr. Swaziland  . Aortic stenosis     . AR (allergic rhinitis)   . Osteoarthritis   . Prostate cancer     Dr. Achilles Dunk    Past Surgical History  Procedure Date  . Right and left ih 2003  . 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 Echo and carotids 1/08    neg  . Pacemaker insertion 11/08    Medtronic   . Cataract rt. eye 6/09    Family History  Problem Relation Age of Onset  . Diabetes      DM - GPs  . Colon cancer Neg Hx   . Prostate cancer Neg Hx     History   Social History  . Marital Status: Married    Spouse Name: N/A    Number of Children: 3  . Years of Education: N/A   Occupational History  . Forensic scientist      Retired   Social History Main Topics  . Smoking status: Never Smoker   . Smokeless tobacco: Not on file  . Alcohol Use: Yes  . Drug Use: No  . Sexually Active: Not on file   Other Topics Concern  . Not on file  Social History Narrative   Married (2nd), 3 childrenEnjoys bridge and Dance movement psychotherapist. Regular exercise --fitness center 3 days per week. Considering starting in poolHas living willRequests DIL [name] to be health care POAHas DNR---done 8/22/13No feeding tube if cognitively unaware   Review of Systems  Constitutional: Negative for fatigue and unexpected weight change.  HENT: Positive for hearing loss. Negative for dental problem.        New hearing aides Recent dental work---has lower dentures Some throat symptoms he relates to pollen  Eyes: Positive for visual disturbance.       Left eye doesn't really work---gets shots for retinal problem (Hemorrhage?) Right eye is fine  Respiratory: Negative for cough, chest tightness and shortness of breath.   Cardiovascular: Positive for leg swelling. Negative for chest pain and palpitations.  Gastrointestinal: Positive for constipation. Negative for blood in stool.       Miralax controls the constipation No sig heartburn  Genitourinary: Positive for frequency. Negative for urgency and difficulty urinating.  Musculoskeletal:  Positive for myalgias. Negative for joint swelling and arthralgias.       Some calf pain---feels "heavier" No back or arthritis pain  Skin: Negative for rash and wound.  Neurological: Negative for dizziness, syncope, light-headedness and headaches.  Hematological: Negative for adenopathy. Does not bruise/bleed easily.  Psychiatric/Behavioral: Negative for disturbed wake/sleep cycle and dysphoric mood. The patient is not nervous/anxious.        Sleeps okay with melatonin       Objective:   Physical Exam  Constitutional: He appears well-developed and well-nourished. No distress.  HENT:  Mouth/Throat: Oropharynx is clear and moist. No oropharyngeal exudate.  Eyes: Conjunctivae and EOM are normal. Pupils are equal, round, and reactive to light.  Neck: Normal range of motion. Neck supple. No thyromegaly present.  Cardiovascular: Normal rate, regular rhythm and intact distal pulses.  Exam reveals no gallop.   Murmur heard.      Gr 2/6 coarse aortic systolic murmur  Pulmonary/Chest: Effort normal and breath sounds normal. No respiratory distress. He has no wheezes. He has no rales.  Abdominal: Soft. There is no tenderness.  Musculoskeletal: He exhibits no tenderness.       Trace edema in calves  Lymphadenopathy:    He has no cervical adenopathy.  Neurological: He exhibits normal muscle tone.       weakness  Skin: No rash noted. No erythema.  Psychiatric: He has a normal mood and affect. His behavior is normal. Thought content normal.          Assessment & Plan:

## 2012-08-14 NOTE — Assessment & Plan Note (Signed)
BP Readings from Last 3 Encounters:  08/14/12 120/78  02/29/12 151/89  12/04/11 110/70   Good control on the losartan No changes needed Due for labs

## 2012-08-14 NOTE — Assessment & Plan Note (Signed)
No problems with statin Will continue given past history of TIA

## 2012-08-14 NOTE — Assessment & Plan Note (Signed)
No symptoms Will hold off on any evaluation

## 2012-08-21 ENCOUNTER — Encounter: Payer: Self-pay | Admitting: Internal Medicine

## 2012-08-22 ENCOUNTER — Encounter: Payer: Self-pay | Admitting: Internal Medicine

## 2012-09-21 ENCOUNTER — Emergency Department: Payer: Self-pay | Admitting: Emergency Medicine

## 2012-09-24 DIAGNOSIS — M899 Disorder of bone, unspecified: Secondary | ICD-10-CM

## 2012-09-24 DIAGNOSIS — M949 Disorder of cartilage, unspecified: Secondary | ICD-10-CM

## 2012-09-25 ENCOUNTER — Encounter: Payer: Self-pay | Admitting: Internal Medicine

## 2012-09-25 ENCOUNTER — Non-Acute Institutional Stay: Payer: Medicare Other | Admitting: Internal Medicine

## 2012-09-25 VITALS — BP 110/60 | HR 68 | Temp 98.1°F | Resp 18 | Wt 180.0 lb

## 2012-09-25 DIAGNOSIS — Z0289 Encounter for other administrative examinations: Secondary | ICD-10-CM

## 2012-09-25 NOTE — Assessment & Plan Note (Signed)
ER records reviewed I don't have recent PSA values but patient states they have been close to 1--this and the appearance make metastatic prostate cancer unlikely Discussed the options--he wants to be aggressive and look into this The fall is probably related to the bony lesion and now he can't walk---so he is already symptomatic. This makes aggressive diagnosis and treatment more reasonable  Will check bone scan Serum protein electrophoresis already pending Will set up oncology consult

## 2012-09-25 NOTE — Progress Notes (Signed)
Subjective:    Patient ID: Aaron Cervantes, male    DOB: [birth year], 78 y.o.   MRN: [id number]  HPI Daughter is here for his birthday Reviewed with Albin Felling RN  Larey Seat and tripped in room  Seen in ER and sent back 1-3 lytic lesions noted on pelvic x-ray left acetabulum and possibly right. ?left femur Still not able to walk--using wheelchair Has started with PT No pain unless he tries to stand on it or walk  Has been getting PSA tests every 6 months Reports that they have been controlled on the casodex  Current Outpatient Prescriptions on File Prior to Visit  Medication Sig Dispense Refill  . allopurinol (ZYLOPRIM) 300 MG tablet Take 300 mg by mouth daily.        Marland Kitchen aspirin 81 MG EC tablet Take 81 mg by mouth daily.        . bicalutamide (CASODEX) 50 MG tablet Take 50 mg by mouth daily.        . bimatoprost (LUMIGAN) 0.03 % ophthalmic solution Place 1 drop into the left eye every morning.      . bisacodyl (DULCOLAX) 5 MG EC tablet Take 5 mg by mouth daily as needed.      Marland Kitchen losartan (COZAAR) 50 MG tablet Take 1 tablet by mouth Daily.      . Melatonin 3 MG TABS Take 1 tablet by mouth at bedtime.      . polyethylene glycol (MIRALAX / GLYCOLAX) packet Take 17 g by mouth daily.      . pravastatin (PRAVACHOL) 20 MG tablet Take 20 mg by mouth daily.        Marland Kitchen DISCONTD: losartan-hydrochlorothiazide (HYZAAR) 50-12.5 MG per tablet Take 1 tablet by mouth daily.          Allergies  Allergen Reactions  . Sulfonamide Derivatives     REACTION: unspecified    Past Medical History  Diagnosis Date  . Gout   . HTN (hypertension)   . TIA (transient ischemic attack) 1/08  . HLD (hyperlipidemia)   . Complete heart block     Dr. Swaziland  . Aortic stenosis   . AR (allergic rhinitis)   . Osteoarthritis   . Prostate cancer     Dr. Achilles Dunk    Past Surgical History  Procedure Date  . Right and left ih 2003  . 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 Echo and carotids 1/08    neg  . Pacemaker  insertion 11/08    Medtronic   . Cataract rt. eye 6/09    Family History  Problem Relation Age of Onset  . Diabetes      DM - GPs  . Colon cancer Neg Hx   . Prostate cancer Neg Hx     History   Social History  . Marital Status: Married    Spouse Name: N/A    Number of Children: 3  . Years of Education: N/A   Occupational History  . Forensic scientist      Retired   Social History Main Topics  . Smoking status: Never Smoker   . Smokeless tobacco: Not on file  . Alcohol Use: Yes  . Drug Use: No  . Sexually Active: Not on file   Other Topics Concern  . Not on file   Social History Narrative   Married (2nd), 3 childrenEnjoys bridge and Dance movement psychotherapist. Regular exercise --fitness center 3 days per week. Considering starting in poolHas living willRequests DIL [name] to  be health care POAHas DNR---done 8/22/13No feeding tube if cognitively unaware   Review of Systems Appetite is fine Weight is stable    Objective:   Physical Exam  Constitutional: He appears well-developed and well-nourished.  Musculoskeletal:       Trouble standing No focal weakness          Assessment & Plan:

## 2012-09-26 ENCOUNTER — Ambulatory Visit: Payer: Self-pay | Admitting: Internal Medicine

## 2012-09-26 ENCOUNTER — Encounter: Payer: Self-pay | Admitting: Internal Medicine

## 2012-09-29 ENCOUNTER — Encounter: Payer: Self-pay | Admitting: *Deleted

## 2012-09-29 ENCOUNTER — Encounter: Payer: Self-pay | Admitting: Internal Medicine

## 2012-11-13 ENCOUNTER — Encounter: Payer: Self-pay | Admitting: Internal Medicine

## 2012-11-13 ENCOUNTER — Non-Acute Institutional Stay: Payer: Medicare Other | Admitting: Internal Medicine

## 2012-11-13 VITALS — BP 118/62 | HR 68 | Temp 97.3°F | Resp 18 | Wt 184.0 lb

## 2012-11-13 DIAGNOSIS — M79609 Pain in unspecified limb: Secondary | ICD-10-CM

## 2012-11-13 DIAGNOSIS — M79605 Pain in left leg: Secondary | ICD-10-CM

## 2012-11-13 NOTE — Assessment & Plan Note (Signed)
Seemed to improve with PT but has worsened again May have combination of pain from pelvic lesions (probably from prostate cancer before Rx---stable from 2011) and knee arthritis Only has pain with weight bearing  Will add regular tylenol Prn tramadol Will change this to regular if he needs it regularly

## 2012-11-13 NOTE — Progress Notes (Signed)
Subjective:    Patient ID: Aaron Cervantes, male    DOB: September 23, 1917, 78 y.o.   MRN: [id number]  HPI Concern for increasing left hip pain Hard to get out now Can't get over to [facility] as often to see wife Hard to walk at all Some swellling behind knee No calf or ankle edema No pain sitting or in bed Pain down thigh to knee also Tylenol does help some  Did have relief of pain with the PT It has returned since finishing though  Went to urologist yesterday Started on tamsulosin  Did get leuprolide Rx   Current Outpatient Prescriptions on File Prior to Visit  Medication Sig Dispense Refill  . allopurinol (ZYLOPRIM) 300 MG tablet Take 300 mg by mouth daily.        Marland Kitchen aspirin 81 MG EC tablet Take 81 mg by mouth daily.        . bicalutamide (CASODEX) 50 MG tablet Take 50 mg by mouth daily.        . bimatoprost (LUMIGAN) 0.03 % ophthalmic solution Place 1 drop into the left eye every morning.      . bisacodyl (DULCOLAX) 5 MG EC tablet Take 5 mg by mouth daily as needed.      Marland Kitchen losartan (COZAAR) 50 MG tablet Take 1 tablet by mouth Daily.      . Melatonin 3 MG TABS Take 1 tablet by mouth at bedtime.      . polyethylene glycol (MIRALAX / GLYCOLAX) packet Take 17 g by mouth daily.      . pravastatin (PRAVACHOL) 20 MG tablet Take 20 mg by mouth daily.        . [DISCONTINUED] losartan-hydrochlorothiazide (HYZAAR) 50-12.5 MG per tablet Take 1 tablet by mouth daily.          Allergies  Allergen Reactions  . Sulfonamide Derivatives     REACTION: unspecified    Past Medical History  Diagnosis Date  . Gout   . HTN (hypertension)   . TIA (transient ischemic attack) 1/08  . HLD (hyperlipidemia)   . Complete heart block     Dr. [place]  . Aortic stenosis   . AR (allergic rhinitis)   . Osteoarthritis   . Prostate cancer     Dr. Achilles Dunk    Past Surgical History  Procedure Date  . Right and left ih 2003  . 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 Echo and carotids 1/08    neg   . Pacemaker insertion 11/08    [employer]   . Cataract rt. eye 6/09    Family History  Problem Relation Age of Onset  . Diabetes      DM - GPs  . Colon cancer Neg Hx   . Prostate cancer Neg Hx     History   Social History  . Marital Status: Married    Spouse Name: N/A    Number of Children: 3  . Years of Education: N/A   Occupational History  . Forensic scientist      Retired   Social History Main Topics  . Smoking status: Never Smoker   . Smokeless tobacco: Not on file  . Alcohol Use: Yes  . Drug Use: No  . Sexually Active: Not on file   Other Topics Concern  . Not on file   Social History Narrative   Married (2nd), 3 childrenEnjoys bridge and Dance movement psychotherapist. Regular exercise --fitness center 3 days per week. Considering starting in poolHas living  willRequests DIL [name] to be health care POAHas DNR---done 8/22/13No feeding tube if cognitively unaware   Review of Systems Has routine follow up with Dr Mariah Milling cardiologist---asks that I cancel. Will make follow up there prn only No fever Appetite is fine    Objective:   Physical Exam  Musculoskeletal:       No localized tenderness in left knee or hip Fairly normal passive ROM of left hip Some crepitus with ROM of both knees Small non tender Baker's cyst on left          Assessment & Plan:

## 2012-11-24 ENCOUNTER — Ambulatory Visit: Payer: Medicare Other | Admitting: Cardiovascular Disease

## 2012-11-25 ENCOUNTER — Other Ambulatory Visit: Payer: Self-pay | Admitting: Internal Medicine

## 2012-12-09 ENCOUNTER — Emergency Department: Payer: Self-pay | Admitting: Emergency Medicine

## 2012-12-09 LAB — URINALYSIS, COMPLETE
Bacteria: NONE SEEN
Bilirubin,UR: NEGATIVE
Blood: NEGATIVE
Glucose,UR: NEGATIVE mg/dL (ref 0–75)
Hyaline Cast: 1
Leukocyte Esterase: NEGATIVE
Ph: 6 (ref 4.5–8.0)
Specific Gravity: 1.016 (ref 1.003–1.030)
Squamous Epithelial: NONE SEEN

## 2012-12-09 LAB — COMPREHENSIVE METABOLIC PANEL
Anion Gap: 8 (ref 7–16)
BUN: 27 mg/dL — ABNORMAL HIGH (ref 7–18)
Calcium, Total: 9.2 mg/dL (ref 8.5–10.1)
Chloride: 106 mmol/L (ref 98–107)
Co2: 24 mmol/L (ref 21–32)
EGFR (African American): 59 — ABNORMAL LOW
EGFR (Non-African Amer.): 51 — ABNORMAL LOW
Potassium: 4.4 mmol/L (ref 3.5–5.1)
SGOT(AST): 35 U/L (ref 15–37)
SGPT (ALT): 20 U/L (ref 12–78)
Sodium: 138 mmol/L (ref 136–145)
Total Protein: 6.9 g/dL (ref 6.4–8.2)

## 2012-12-09 LAB — CK TOTAL AND CKMB (NOT AT ARMC)
CK, Total: 56 U/L (ref 35–232)
CK-MB: 0.7 ng/mL (ref 0.5–3.6)

## 2012-12-09 LAB — CBC
HCT: 39.1 % — ABNORMAL LOW (ref 40.0–52.0)
MCH: 33 pg (ref 26.0–34.0)
MCV: 100 fL (ref 80–100)
Platelet: 202 10*3/uL (ref 150–440)
RDW: 14.3 % (ref 11.5–14.5)

## 2013-01-07 ENCOUNTER — Ambulatory Visit: Payer: Medicare Other | Admitting: Internal Medicine

## 2013-02-12 ENCOUNTER — Non-Acute Institutional Stay: Payer: Medicare Other | Admitting: Internal Medicine

## 2013-02-12 ENCOUNTER — Encounter: Payer: Self-pay | Admitting: Internal Medicine

## 2013-02-12 VITALS — BP 110/66 | HR 68 | Temp 97.6°F | Resp 18 | Wt 182.0 lb

## 2013-02-12 DIAGNOSIS — M79609 Pain in unspecified limb: Secondary | ICD-10-CM

## 2013-02-12 DIAGNOSIS — C61 Malignant neoplasm of prostate: Secondary | ICD-10-CM

## 2013-02-12 DIAGNOSIS — E785 Hyperlipidemia, unspecified: Secondary | ICD-10-CM

## 2013-02-12 DIAGNOSIS — I10 Essential (primary) hypertension: Secondary | ICD-10-CM

## 2013-02-12 DIAGNOSIS — I359 Nonrheumatic aortic valve disorder, unspecified: Secondary | ICD-10-CM

## 2013-02-12 DIAGNOSIS — M79605 Pain in left leg: Secondary | ICD-10-CM

## 2013-02-12 NOTE — Assessment & Plan Note (Signed)
BP Readings from Last 3 Encounters:  02/12/13 110/66  11/13/12 118/62  09/25/12 110/60   Good control No changes needed

## 2013-02-12 NOTE — Assessment & Plan Note (Signed)
Better with regular tylenol Has tramadol for prn

## 2013-02-12 NOTE — Assessment & Plan Note (Signed)
Soft murmur No symptoms from this so will hold off on any evaluation

## 2013-02-12 NOTE — Assessment & Plan Note (Signed)
Will continue statin since history of at least TIA

## 2013-02-12 NOTE — Progress Notes (Signed)
Subjective:    Patient ID: Aaron Cervantes, male    DOB: [birth year], 79 y.o.   MRN: [id number]  HPI Reviewed status with Albin Felling RN  Hip and leg pain are better On regular tylenol and this is helping Walking okay with rollator for his balance  Still has nocturia--no worse Does have some dribbling and incontinence---uses protection at night mostly Tries to limit oral fluids after lunch Continues on the flomax  Bowels okay with miralax Adjusts the dose as needed  No chest pain No breathing problems No dizziness or syncope  Was having bad headaches Went to eye doctor and on new meds---this seems to be helping the glaucoma  No problems with gout Remains on allopurinol  Current Outpatient Prescriptions on File Prior to Visit  Medication Sig Dispense Refill  . acetaminophen (TYLENOL) 650 MG CR tablet Take 650 mg by mouth 3 (three) times daily.      Marland Kitchen allopurinol (ZYLOPRIM) 300 MG tablet Take 300 mg by mouth daily.        Marland Kitchen aspirin 81 MG EC tablet Take 81 mg by mouth daily.        . bicalutamide (CASODEX) 50 MG tablet Take 50 mg by mouth daily.        . bimatoprost (LUMIGAN) 0.03 % ophthalmic solution Place 1 drop into the left eye every morning.      . bisacodyl (DULCOLAX) 5 MG EC tablet Take 5 mg by mouth daily as needed.      Marland Kitchen losartan (COZAAR) 50 MG tablet Take 1 tablet by mouth Daily.      . Melatonin 3 MG TABS Take 1 tablet by mouth at bedtime.      . polyethylene glycol (MIRALAX / GLYCOLAX) packet Take 17 g by mouth daily.      Marland Kitchen PRAVACHOL 20 MG tablet TAKE ONE TABLET BY MOUTH EACH DAY. (IMPROVES CHOLESTEROL)  30 tablet  11  . Tamsulosin HCl (FLOMAX) 0.4 MG CAPS Take 0.4 mg by mouth daily.      . traMADol (ULTRAM) 50 MG tablet Take 25-50 mg by mouth 3 (three) times daily as needed.      . [DISCONTINUED] losartan-hydrochlorothiazide (HYZAAR) 50-12.5 MG per tablet Take 1 tablet by mouth daily.         No current facility-administered medications on file prior to visit.     Allergies  Allergen Reactions  . Sulfonamide Derivatives     REACTION: unspecified    Past Medical History  Diagnosis Date  . Gout   . HTN (hypertension)   . TIA (transient ischemic attack) 1/08  . HLD (hyperlipidemia)   . Complete heart block     Dr. [place]  . Aortic stenosis   . AR (allergic rhinitis)   . Osteoarthritis   . Prostate cancer     Dr. Achilles Dunk    Past Surgical History  Procedure Laterality Date  . Right and left ih  2003  . 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 Echo and carotids  1/08    neg  . Pacemaker insertion  11/08    [employer]   . Cataract rt. eye  6/09    Family History  Problem Relation Age of Onset  . Diabetes      DM - GPs  . Colon cancer Neg Hx   . Prostate cancer Neg Hx     History   Social History  . Marital Status: Married    Spouse Name: N/A  Number of Children: 3  . Years of Education: N/A   Occupational History  . Forensic scientist      Retired   Social History Main Topics  . Smoking status: Never Smoker   . Smokeless tobacco: Not on file  . Alcohol Use: Yes  . Drug Use: No  . Sexually Active: Not on file   Other Topics Concern  . Not on file   Social History Narrative   Married (2nd), 3 children   Enjoys bridge and Dance movement psychotherapist.    Regular exercise --fitness center 3 days per week. Considering starting in pool      Has living will   Requests DIL [name] to be health care POA   Has DNR---done 08/14/12   No feeding tube if cognitively unaware         Review of Systems Sleeps fairly well with the melatonin. Still will nap in chair in front of his TV Appetite is good Weight is stable    Objective:   Physical Exam  Constitutional: He appears well-developed and well-nourished. No distress.  Neck: Normal range of motion. Neck supple. No thyromegaly present.  Cardiovascular: Normal rate and regular rhythm.  Exam reveals no gallop.   Murmur heard. Soft aortic systolic murmur  Pulmonary/Chest:  Effort normal and breath sounds normal. No respiratory distress. He has no wheezes. He has no rales.  Abdominal: Soft. There is no tenderness.  Musculoskeletal: He exhibits edema. He exhibits no tenderness.  Thick calves without pitting  Lymphadenopathy:    He has no cervical adenopathy.  Skin:  Several keratoses that are scabbed on head and right back---recent Rx at derm  Psychiatric: He has a normal mood and affect. His behavior is normal.          Assessment & Plan:

## 2013-02-12 NOTE — Assessment & Plan Note (Signed)
Continues with the urologist and Rx Does not seem to have active issues now

## 2013-02-23 ENCOUNTER — Other Ambulatory Visit: Payer: Self-pay | Admitting: Internal Medicine

## 2013-03-19 ENCOUNTER — Encounter: Payer: Self-pay | Admitting: Internal Medicine

## 2013-03-20 ENCOUNTER — Ambulatory Visit (INDEPENDENT_AMBULATORY_CARE_PROVIDER_SITE_OTHER): Payer: Medicare Other | Admitting: Internal Medicine

## 2013-03-20 ENCOUNTER — Encounter: Payer: Self-pay | Admitting: Internal Medicine

## 2013-03-20 VITALS — BP 128/70 | HR 66 | Temp 97.4°F | Wt 179.0 lb

## 2013-03-20 DIAGNOSIS — M545 Low back pain: Secondary | ICD-10-CM

## 2013-03-20 LAB — POCT URINALYSIS DIPSTICK
Blood, UA: NEGATIVE
Ketones, UA: NEGATIVE
Spec Grav, UA: 1.02
Urobilinogen, UA: NEGATIVE
pH, UA: 7

## 2013-03-20 NOTE — Progress Notes (Signed)
Subjective:    Patient ID: Leemon Ayala, male    DOB: 1917/10/09, 79 y.o.   MRN: [id number]  HPI Here with aide from [facility]  "I feel really lousy" Concerned about nocturia---relates back 5 months or so Is able to get back to sleep Soreness in abdomen-- ?gassy "Damn arthritis" in low back--not a problem in bed  No dysuria or hematuria  On tylenol but hasn't been taking the tramadol  Current Outpatient Prescriptions on File Prior to Visit  Medication Sig Dispense Refill  . acetaminophen (TYLENOL) 650 MG CR tablet Take 650 mg by mouth 3 (three) times daily.      Marland Kitchen allopurinol (ZYLOPRIM) 300 MG tablet Take 300 mg by mouth daily.        Marland Kitchen aspirin 81 MG EC tablet Take 81 mg by mouth daily.        . bicalutamide (CASODEX) 50 MG tablet Take 50 mg by mouth daily.        . bimatoprost (LUMIGAN) 0.03 % ophthalmic solution Place 1 drop into the left eye every morning.      . bisacodyl (DULCOLAX) 5 MG EC tablet Take 5 mg by mouth daily as needed.      . brimonidine-timolol (COMBIGAN) 0.2-0.5 % ophthalmic solution Place 1 drop into the left eye every 12 (twelve) hours.      . cyclopentolate (CYCLODRYL,CYCLOGYL) 2 % ophthalmic solution Place 1 drop into the left eye daily.      . Difluprednate (DUREZOL) 0.05 % EMUL Place 1 drop into the left eye daily.      Marland Kitchen losartan (COZAAR) 50 MG tablet TAKE (1) TABLET BY MOUTH ONCE A DAY. (HIGH BLOOD PRESSURE)  30 tablet  11  . Melatonin 3 MG TABS Take 1 tablet by mouth at bedtime.      . polyethylene glycol (MIRALAX / GLYCOLAX) packet Take 17 g by mouth daily.      Marland Kitchen PRAVACHOL 20 MG tablet TAKE ONE TABLET BY MOUTH EACH DAY. (IMPROVES CHOLESTEROL)  30 tablet  11  . Tamsulosin HCl (FLOMAX) 0.4 MG CAPS Take 0.4 mg by mouth daily.      . traMADol (ULTRAM) 50 MG tablet Take 25-50 mg by mouth 3 (three) times daily as needed.      . [DISCONTINUED] losartan-hydrochlorothiazide (HYZAAR) 50-12.5 MG per tablet Take 1 tablet by mouth daily.         No current  facility-administered medications on file prior to visit.    Allergies  Allergen Reactions  . Sulfonamide Derivatives     REACTION: unspecified    Past Medical History  Diagnosis Date  . Gout   . HTN (hypertension)   . TIA (transient ischemic attack) 1/08  . HLD (hyperlipidemia)   . Complete heart block     Dr. [place]  . Aortic stenosis   . AR (allergic rhinitis)   . Osteoarthritis   . Prostate cancer     Dr. Achilles Dunk    Past Surgical History  Procedure Laterality Date  . Right and left ih  2003  . 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 Echo and carotids  1/08    neg  . Pacemaker insertion  11/08    [employer]   . Cataract rt. eye  6/09    Family History  Problem Relation Age of Onset  . Diabetes      DM - [facility]  . Colon cancer Neg Hx   . Prostate cancer Neg Hx  History   Social History  . Marital Status: Married    Spouse Name: N/A    Number of Children: 3  . Years of Education: N/A   Occupational History  . Forensic scientist      Retired   Social History Main Topics  . Smoking status: Never Smoker   . Smokeless tobacco: Never Used  . Alcohol Use: Yes  . Drug Use: No  . Sexually Active: Not on file   Other Topics Concern  . Not on file   Social History Narrative   Married (2nd), 3 children   Enjoys bridge and Dance movement psychotherapist.    Regular exercise --fitness center 3 days per week. Considering starting in pool      Has living will   Requests DIL [name] to be health care POA   Has DNR---done 08/14/12   No feeding tube if cognitively unaware         Review of Systems Sleeps okay with the melatonin Appetite is okay Weight is down 1# or so--not much change over past year No cough No SOB    Objective:   Physical Exam  Constitutional: He appears well-developed and well-nourished. No distress.  Neck: Normal range of motion. Neck supple. No thyromegaly present.  Cardiovascular: Normal rate, regular rhythm and normal heart sounds.  Exam  reveals no gallop.   No murmur heard. Pulmonary/Chest: Effort normal and breath sounds normal. No respiratory distress. He has no wheezes. He has no rales.  Abdominal: Soft. There is no tenderness.  Musculoskeletal: He exhibits no edema.   No spine or S-I tenderness  Lymphadenopathy:    He has no cervical adenopathy.          Assessment & Plan:

## 2013-03-20 NOTE — Assessment & Plan Note (Signed)
Probably the reason he just generally doesn't feel good Urinalysis negative Will add regular tramadol and keep prn

## 2013-03-31 ENCOUNTER — Other Ambulatory Visit: Payer: Self-pay

## 2013-03-31 ENCOUNTER — Ambulatory Visit (INDEPENDENT_AMBULATORY_CARE_PROVIDER_SITE_OTHER): Payer: Medicare Other | Admitting: *Deleted

## 2013-03-31 DIAGNOSIS — I442 Atrioventricular block, complete: Secondary | ICD-10-CM

## 2013-03-31 LAB — PACEMAKER DEVICE OBSERVATION
AL IMPEDENCE PM: 445 Ohm
AL THRESHOLD: 0.75 V
ATRIAL PACING PM: 33
BATTERY VOLTAGE: 2.78 V
RV LEAD IMPEDENCE PM: 544 Ohm
VENTRICULAR PACING PM: 100

## 2013-03-31 NOTE — Progress Notes (Signed)
PPM check 

## 2013-04-24 ENCOUNTER — Encounter: Payer: Self-pay | Admitting: Internal Medicine

## 2013-04-28 ENCOUNTER — Other Ambulatory Visit: Payer: Self-pay | Admitting: *Deleted

## 2013-04-28 MED ORDER — ALLOPURINOL 300 MG PO TABS: 300.0000 mg | ORAL_TABLET | Freq: Every day | ORAL | Status: AC

## 2013-04-28 NOTE — Telephone Encounter (Signed)
I didn't see where this medication was filled by our office. Is it ok to fill?

## 2013-04-28 NOTE — Telephone Encounter (Signed)
Not sure why I would normally refill this and it is on his list  Okay to fill for a year

## 2013-04-28 NOTE — Telephone Encounter (Signed)
rx sent to pharmacy by e-script  

## 2013-05-14 ENCOUNTER — Encounter: Payer: Self-pay | Admitting: Internal Medicine

## 2013-05-14 ENCOUNTER — Non-Acute Institutional Stay: Payer: Medicare Other | Admitting: Internal Medicine

## 2013-05-14 VITALS — BP 116/68 | HR 62 | Temp 97.4°F | Resp 16 | Wt 173.0 lb

## 2013-05-14 DIAGNOSIS — M109 Gout, unspecified: Secondary | ICD-10-CM

## 2013-05-14 DIAGNOSIS — M545 Low back pain: Secondary | ICD-10-CM

## 2013-05-14 DIAGNOSIS — C61 Malignant neoplasm of prostate: Secondary | ICD-10-CM

## 2013-05-14 DIAGNOSIS — I10 Essential (primary) hypertension: Secondary | ICD-10-CM

## 2013-05-14 DIAGNOSIS — E785 Hyperlipidemia, unspecified: Secondary | ICD-10-CM

## 2013-05-14 NOTE — Assessment & Plan Note (Signed)
Will continue statin for now given history of TIA

## 2013-05-14 NOTE — Assessment & Plan Note (Signed)
Quiet on allopurinol 

## 2013-05-14 NOTE — Assessment & Plan Note (Addendum)
Improved with just tylenol and the regular tramadol

## 2013-05-14 NOTE — Assessment & Plan Note (Signed)
BP Readings from Last 3 Encounters:  05/14/13 116/68  03/20/13 128/70  02/12/13 110/66   Good control No changes needed

## 2013-05-14 NOTE — Progress Notes (Signed)
Subjective:    Patient ID: Aaron Cervantes, male    DOB: [birth year], 79 y.o.   MRN: [id number]  HPI Reviewed status with Albin Felling, RN Has noted some decreased energy-- don't have enough "juice" Has to "fight my way out of bed" in the morning Doesn't think it is anything serious Daughter has had some medical issues (pleurisy) and was in hospital---this may have caused some of his fatigue No persistent depression though  Continues on prostate cancer Rx Keeping up with the urologist  Back pain is not a big issue now Hasn't been needing the tramadol Some degree of "weakness" overall ---not specific Does ride exercise bike daily still though  No chest pain No SOB No dizziness or syncope  Current Outpatient Prescriptions on File Prior to Visit  Medication Sig Dispense Refill  . acetaminophen (TYLENOL) 650 MG CR tablet Take 650 mg by mouth 3 (three) times daily.      Marland Kitchen allopurinol (ZYLOPRIM) 300 MG tablet Take 1 tablet (300 mg total) by mouth daily.  30 tablet  11  . aspirin 81 MG EC tablet Take 81 mg by mouth daily.        . bicalutamide (CASODEX) 50 MG tablet Take 50 mg by mouth daily.        . bimatoprost (LUMIGAN) 0.03 % ophthalmic solution Place 1 drop into the left eye every morning.      . bisacodyl (DULCOLAX) 5 MG EC tablet Take 5 mg by mouth daily as needed.      . brimonidine-timolol (COMBIGAN) 0.2-0.5 % ophthalmic solution Place 1 drop into the left eye every 12 (twelve) hours.      . cyclopentolate (CYCLODRYL,CYCLOGYL) 2 % ophthalmic solution Place 1 drop into the left eye daily.      . Difluprednate (DUREZOL) 0.05 % EMUL Place 1 drop into the left eye daily.      Marland Kitchen losartan (COZAAR) 50 MG tablet TAKE (1) TABLET BY MOUTH ONCE A DAY. (HIGH BLOOD PRESSURE)  30 tablet  11  . Melatonin 3 MG TABS Take 1 tablet by mouth at bedtime.      Bertram Gala Glycol-Propyl Glycol (SYSTANE) 0.4-0.3 % SOLN Apply 1 drop to eye 2 (two) times daily. Both eyes      . polyethylene glycol (MIRALAX /  GLYCOLAX) packet Take 17 g by mouth daily.      Marland Kitchen PRAVACHOL 20 MG tablet TAKE ONE TABLET BY MOUTH EACH DAY. (IMPROVES CHOLESTEROL)  30 tablet  11  . Tamsulosin HCl (FLOMAX) 0.4 MG CAPS Take 0.4 mg by mouth daily.      . traMADol (ULTRAM) 50 MG tablet Take 25 mg by mouth 3 (three) times daily. And 25mg  tid prn      . [DISCONTINUED] losartan-hydrochlorothiazide (HYZAAR) 50-12.5 MG per tablet Take 1 tablet by mouth daily.         No current facility-administered medications on file prior to visit.    Allergies  Allergen Reactions  . Sulfonamide Derivatives     REACTION: unspecified    Past Medical History  Diagnosis Date  . Gout   . HTN (hypertension)   . TIA (transient ischemic attack) 1/08  . HLD (hyperlipidemia)   . Complete heart block     Dr. [place]  . Aortic stenosis   . AR (allergic rhinitis)   . Osteoarthritis   . Prostate cancer     Dr. Achilles Dunk    Past Surgical History  Procedure Laterality Date  . Right and left ih  2003  .  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 Echo and carotids  1/08    neg  . Pacemaker insertion  11/08    [employer]   . Cataract rt. eye  6/09    Family History  Problem Relation Age of Onset  . Diabetes      DM - [facility]  . Colon cancer Neg Hx   . Prostate cancer Neg Hx     History   Social History  . Marital Status: Married    Spouse Name: N/A    Number of Children: 3  . Years of Education: N/A   Occupational History  . Forensic scientist      Retired   Social History Main Topics  . Smoking status: Never Smoker   . Smokeless tobacco: Never Used  . Alcohol Use: Yes  . Drug Use: No  . Sexually Active: Not on file   Other Topics Concern  . Not on file   Social History Narrative   Married (2nd), 3 children   Enjoys bridge and Dance movement psychotherapist.    Regular exercise --fitness center 3 days per week. Considering starting in pool      Has living will   Requests DIL [name] to be health care POA   Has DNR---done 08/14/12   No  feeding tube if cognitively unaware         Review of Systems Sleeps okay Appetite is not so good--has lost a few pounds    Objective:   Physical Exam  Constitutional: He appears well-developed and well-nourished. No distress.  Neck: Normal range of motion. Neck supple.  Cardiovascular: Normal rate and regular rhythm.  Exam reveals no gallop.   Murmur heard. Gr 2/6 aortic systolic murmur  Pulmonary/Chest: Effort normal and breath sounds normal. No respiratory distress. He has no wheezes. He has no rales.  Abdominal: Soft. There is no tenderness.  Musculoskeletal: He exhibits edema. He exhibits no tenderness.  Trace ankle edema  Lymphadenopathy:    He has no cervical adenopathy.  Psychiatric: He has a normal mood and affect. His behavior is normal.  Not depressed Acknowledges recent down times but feels they are minor          Assessment & Plan:

## 2013-05-14 NOTE — Assessment & Plan Note (Signed)
Continues on Rx from urologist

## 2013-06-12 ENCOUNTER — Ambulatory Visit: Payer: Medicare Other | Admitting: Family Medicine

## 2013-06-25 ENCOUNTER — Other Ambulatory Visit: Payer: Self-pay | Admitting: Internal Medicine

## 2013-08-11 ENCOUNTER — Encounter: Payer: Self-pay | Admitting: Family Medicine

## 2013-08-11 ENCOUNTER — Ambulatory Visit (INDEPENDENT_AMBULATORY_CARE_PROVIDER_SITE_OTHER): Payer: Medicare Other | Admitting: Family Medicine

## 2013-08-11 VITALS — BP 102/62 | HR 74 | Temp 98.0°F | Wt 166.5 lb

## 2013-08-11 DIAGNOSIS — N390 Urinary tract infection, site not specified: Secondary | ICD-10-CM | POA: Insufficient documentation

## 2013-08-11 DIAGNOSIS — R350 Frequency of micturition: Secondary | ICD-10-CM

## 2013-08-11 LAB — POCT URINALYSIS DIPSTICK
Glucose, UA: NEGATIVE
Urobilinogen, UA: 0.2

## 2013-08-11 LAB — POCT UA - MICROSCOPIC ONLY: Yeast, UA: 0

## 2013-08-11 MED ORDER — LEVOFLOXACIN 250 MG PO TABS: 250.0000 mg | ORAL_TABLET | Freq: Every day | ORAL | Status: DC

## 2013-08-11 NOTE — Assessment & Plan Note (Signed)
With voiding symptoms in elderly male with hx of prostate cancer on flomax  Will cx urine tx with levaquin 250 mg daily for 7 d Enc water intake Update if not starting to improve in several days  or if worsening  (esp if MS change) Order to [facility] to re check ua in a week

## 2013-08-11 NOTE — Progress Notes (Signed)
Subjective:    Patient ID: Aaron Cervantes, male    DOB: 1917/07/04, 79 y.o.   MRN: [id number]  HPI 79 year old pt of Dr Karle Starch with hx of prostate cancer here with urinary symptoms   Info from [facility] indicate pt has had 2 days of dysuria nd polyuria and is generally listless He notes 2 nights ago - was in the bathroom over 20 times to urinate and thinks he had a fever  Yesterday he was a bit better   Today is wiped out   Has not noticed blood in his urine  No flank pain  No n/v   No confusion  He does take flomax  Sees Dr Achilles Dunk Does not remember having any utis in the past  Patient Active Problem List   Diagnosis Date Noted  . Lytic bone lesions on xray 27-Mar-202013  . Cardiac pacemaker MDT 02/29/2012  . EDEMA 05/18/2010  . AORTIC STENOSIS 04/18/2010  . OSTEOARTHRITIS 04/18/2010  . BACK PAIN, LUMBAR 12/21/2009  . PROSTATE CANCER 01/12/2008  . AV BLOCK, COMPLETE 10/06/2007  . HYPERLIPIDEMIA 07/09/2007  . TRANSIENT ISCHEMIC ATTACK, HX OF 07/09/2007  . GOUT 06/30/2007  . HYPERTENSION 06/30/2007   Past Medical History  Diagnosis Date  . Gout   . HTN (hypertension)   . TIA (transient ischemic attack) 1/08  . HLD (hyperlipidemia)   . Complete heart block     Dr. [place]  . Aortic stenosis   . AR (allergic rhinitis)   . Osteoarthritis   . Prostate cancer     Dr. Achilles Dunk   Past Surgical History  Procedure Laterality Date  . Right and left ih  2003  . 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 Echo and carotids  1/08    neg  . Pacemaker insertion  11/08    [employer]   . Cataract rt. eye  6/09   History  Substance Use Topics  . Smoking status: Never Smoker   . Smokeless tobacco: Never Used  . Alcohol Use: Yes   Family History  Problem Relation Age of Onset  . Diabetes      DM - GPs  . Colon cancer Neg Hx   . Prostate cancer Neg Hx    Allergies  Allergen Reactions  . Sulfonamide Derivatives     REACTION: unspecified   Current Outpatient  Prescriptions on File Prior to Visit  Medication Sig Dispense Refill  . acetaminophen (TYLENOL) 650 MG CR tablet Take 650 mg by mouth 3 (three) times daily.      Marland Kitchen allopurinol (ZYLOPRIM) 300 MG tablet Take 1 tablet (300 mg total) by mouth daily.  30 tablet  11  . aspirin 81 MG EC tablet TAKE 1 TABLET BY MOUTH EACH DAY. ANTI PLATELET AGGREGATION  120 tablet  PRN  . bicalutamide (CASODEX) 50 MG tablet Take 50 mg by mouth daily.        . bimatoprost (LUMIGAN) 0.03 % ophthalmic solution Place 1 drop into the left eye every morning.      . bisacodyl (DULCOLAX) 5 MG EC tablet Take 5 mg by mouth daily as needed.      . brimonidine-timolol (COMBIGAN) 0.2-0.5 % ophthalmic solution Place 1 drop into the left eye every 12 (twelve) hours.      Marland Kitchen losartan (COZAAR) 50 MG tablet TAKE (1) TABLET BY MOUTH ONCE A DAY. (HIGH BLOOD PRESSURE)  30 tablet  11  . Melatonin 3 MG TABS Take 1 tablet by mouth  at bedtime.      Bertram Gala Glycol-Propyl Glycol (SYSTANE) 0.4-0.3 % SOLN Apply 1 drop to eye 2 (two) times daily. Both eyes      . polyethylene glycol (MIRALAX / GLYCOLAX) packet Take 17 g by mouth daily.      Marland Kitchen PRAVACHOL 20 MG tablet TAKE ONE TABLET BY MOUTH EACH DAY. (IMPROVES CHOLESTEROL)  30 tablet  11  . Tamsulosin HCl (FLOMAX) 0.4 MG CAPS Take 0.4 mg by mouth daily.      . traMADol (ULTRAM) 50 MG tablet Take 25 mg by mouth 3 (three) times daily. And 25mg  tid prn      . [DISCONTINUED] losartan-hydrochlorothiazide (HYZAAR) 50-12.5 MG per tablet Take 1 tablet by mouth daily.         No current facility-administered medications on file prior to visit.    Review of Systems Review of Systems  Constitutional: Negative for  appetite change and pos for fatigue/ malaise ENT neg for ST or congestion Eyes: Negative for pain and visual disturbance.  Respiratory: Negative for cough and shortness of breath.   Cardiovascular: Negative for cp or palpitations    Gastrointestinal: Negative for nausea, diarrhea and  constipation.  Genitourinary: posfor urgency and frequency. neg for blood in urine or flank pain  Skin: Negative for pallor or rash   Neurological: Negative for weakness, light-headedness, numbness and headaches.  Hematological: Negative for adenopathy. Does not bruise/bleed easily.  Psychiatric/Behavioral: Negative for dysphoric mood. The patient is not nervous/anxious.         Objective:   Physical Exam  Constitutional: He appears well-developed and well-nourished. No distress.  Frail appearing elderly male   HENT:  Head: Normocephalic and atraumatic.  Eyes: EOM are normal. Pupils are equal, round, and reactive to light.  Some erythema of L eye/lid -per pt he had recent surgery on it  Neck: Normal range of motion. Neck supple.  Cardiovascular: Normal rate and regular rhythm.   Pulmonary/Chest: Effort normal and breath sounds normal.  Abdominal: Soft. Bowel sounds are normal. He exhibits no distension and no mass. There is no tenderness. There is no rebound, no guarding and no CVA tenderness.  No suprapubic tenderness or fullness    Musculoskeletal: He exhibits no edema.  Lymphadenopathy:    He has no cervical adenopathy.  Neurological: He is alert.  Skin: Skin is warm and dry. No rash noted. No pallor.  Psychiatric: He has a normal mood and affect.          Assessment & Plan:

## 2013-08-11 NOTE — Patient Instructions (Addendum)
You have a urinary tract infection I sent your urine for a culture Take the levaquin once daily for 7 days and update me if any side effects or concerns Update if not starting to improve in several days or if worsening

## 2013-08-12 LAB — URINE CULTURE: Colony Count: NO GROWTH

## 2013-08-17 ENCOUNTER — Other Ambulatory Visit: Payer: Self-pay | Admitting: *Deleted

## 2013-08-17 MED ORDER — TRAMADOL HCL 50 MG PO TABS: 25.0000 mg | ORAL_TABLET | Freq: Three times a day (TID) | ORAL | Status: DC

## 2013-08-17 NOTE — Telephone Encounter (Signed)
rx called into pharmacy

## 2013-08-17 NOTE — Telephone Encounter (Signed)
Okay #120 x 5 In [facility] assisted living so they monitor the med

## 2013-08-20 ENCOUNTER — Non-Acute Institutional Stay: Payer: Medicare Other | Admitting: Internal Medicine

## 2013-08-20 ENCOUNTER — Encounter: Payer: Self-pay | Admitting: Internal Medicine

## 2013-08-20 VITALS — BP 120/70 | HR 74 | Temp 97.4°F | Resp 18 | Wt 166.0 lb

## 2013-08-20 DIAGNOSIS — M109 Gout, unspecified: Secondary | ICD-10-CM

## 2013-08-20 DIAGNOSIS — E785 Hyperlipidemia, unspecified: Secondary | ICD-10-CM

## 2013-08-20 DIAGNOSIS — I10 Essential (primary) hypertension: Secondary | ICD-10-CM

## 2013-08-20 DIAGNOSIS — C61 Malignant neoplasm of prostate: Secondary | ICD-10-CM

## 2013-08-20 DIAGNOSIS — N390 Urinary tract infection, site not specified: Secondary | ICD-10-CM

## 2013-08-20 NOTE — Assessment & Plan Note (Signed)
Continues on casodex He isn't sure when his next follow up is

## 2013-08-20 NOTE — Assessment & Plan Note (Signed)
Quiet on allopurinol 

## 2013-08-20 NOTE — Progress Notes (Signed)
Subjective:    Patient ID: Aaron Cervantes, male    DOB: March 15, 1917, 79 y.o.   MRN: [id number]  HPI Reviewed status with Angelica Chessman RN  Still having urinary urgency and some urge incontinence No dysuria No hematuria No fever Still on tamsulosin Done with levaquin Rx  Back pain is better Gets low dose tramadol with good results  No chest pain No SOB No syncope No sig edema--just slight  No gout flares in some time  Current Outpatient Prescriptions on File Prior to Visit  Medication Sig Dispense Refill  . acetaminophen (TYLENOL) 650 MG CR tablet Take 650 mg by mouth 3 (three) times daily.      Marland Kitchen allopurinol (ZYLOPRIM) 300 MG tablet Take 1 tablet (300 mg total) by mouth daily.  30 tablet  11  . aspirin 81 MG EC tablet TAKE 1 TABLET BY MOUTH EACH DAY. ANTI PLATELET AGGREGATION  120 tablet  PRN  . bicalutamide (CASODEX) 50 MG tablet Take 50 mg by mouth daily.        . bimatoprost (LUMIGAN) 0.03 % ophthalmic solution Place 1 drop into the left eye every morning.      . bisacodyl (DULCOLAX) 5 MG EC tablet Take 5 mg by mouth daily as needed.      . brimonidine-timolol (COMBIGAN) 0.2-0.5 % ophthalmic solution Place 1 drop into the left eye every 12 (twelve) hours.      Marland Kitchen losartan (COZAAR) 50 MG tablet TAKE (1) TABLET BY MOUTH ONCE A DAY. (HIGH BLOOD PRESSURE)  30 tablet  11  . Melatonin 3 MG TABS Take 1 tablet by mouth at bedtime.      Bertram Gala Glycol-Propyl Glycol (SYSTANE) 0.4-0.3 % SOLN Apply 1 drop to eye 2 (two) times daily. Both eyes      . polyethylene glycol (MIRALAX / GLYCOLAX) packet Take 17 g by mouth daily.      Marland Kitchen PRAVACHOL 20 MG tablet TAKE ONE TABLET BY MOUTH EACH DAY. (IMPROVES CHOLESTEROL)  30 tablet  11  . prednisoLONE acetate (PRED FORTE) 1 % ophthalmic suspension Place 1 drop into the left eye 4 (four) times daily.      . Tamsulosin HCl (FLOMAX) 0.4 MG CAPS Take 0.4 mg by mouth daily.      . traMADol (ULTRAM) 50 MG tablet Take 0.5 tablets (25 mg total) by mouth 3  (three) times daily. And 25mg  three times daily prn  120 tablet  5  . [DISCONTINUED] losartan-hydrochlorothiazide (HYZAAR) 50-12.5 MG per tablet Take 1 tablet by mouth daily.         No current facility-administered medications on file prior to visit.    Allergies  Allergen Reactions  . Sulfonamide Derivatives     REACTION: unspecified    Past Medical History  Diagnosis Date  . Gout   . HTN (hypertension)   . TIA (transient ischemic attack) 1/08  . HLD (hyperlipidemia)   . Complete heart block     Dr. [place]  . Aortic stenosis   . AR (allergic rhinitis)   . Osteoarthritis   . Prostate cancer     Dr. Achilles Dunk    Past Surgical History  Procedure Laterality Date  . Right and left ih  2003  . 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 Echo and carotids  1/08    neg  . Pacemaker insertion  11/08    [employer]   . Cataract rt. eye  6/09    Family History  Problem Relation Age  of Onset  . Diabetes      DM - GPs  . Colon cancer Neg Hx   . Prostate cancer Neg Hx     History   Social History  . Marital Status: Married    Spouse Name: N/A    Number of Children: 3  . Years of Education: N/A   Occupational History  . Forensic scientist      Retired   Social History Main Topics  . Smoking status: Never Smoker   . Smokeless tobacco: Never Used  . Alcohol Use: Yes  . Drug Use: No  . Sexual Activity: Not on file   Other Topics Concern  . Not on file   Social History Narrative   Married (2nd), 3 children   Enjoys bridge and Dance movement psychotherapist.    Regular exercise --fitness center 3 days per week. Considering starting in pool      Has living will   Requests DIL [name] to be health care POA   Has DNR---done 08/14/12   No feeding tube if cognitively unaware         Review of Systems Has had ongoing memory issues---doesn't seem to be worsening significantly Ongoing care for glaucoma---Rx has changed. Did have some improvement with procedure (pressure 48--36) Sleeps  well Appetite is so-so---- has lost 7# since last visit Walks with a rollator since fall around 9 months ago    Objective:   Physical Exam  Constitutional: He appears well-developed and well-nourished. No distress.  Neck: Normal range of motion. Neck supple. No thyromegaly present.  Cardiovascular: Normal rate, regular rhythm and normal heart sounds.  Exam reveals no gallop.   No murmur heard. Pulmonary/Chest: Effort normal and breath sounds normal. No respiratory distress. He has no wheezes. He has no rales.  Abdominal: Soft. There is no tenderness.  Musculoskeletal: He exhibits edema. He exhibits no tenderness.  Trace ankle/calf edema  Lymphadenopathy:    He has no cervical adenopathy.  Neurological:  Mild recall/memory problems  Psychiatric: He has a normal mood and affect. His behavior is normal.          Assessment & Plan:

## 2013-08-20 NOTE — Assessment & Plan Note (Signed)
Dysuria is better but still with increased urgency and incontinence On tamsulosin and ongoing cancer Rx I will send note to Dr Cope---he should probably direct any ongoing changes for this problem

## 2013-08-20 NOTE — Assessment & Plan Note (Signed)
BP Readings from Last 3 Encounters:  08/20/13 120/70  08/11/13 102/62  05/14/13 116/68   Good control No changes needed

## 2013-08-20 NOTE — Assessment & Plan Note (Signed)
Given history of TIA--- will continue the statin

## 2013-08-27 ENCOUNTER — Other Ambulatory Visit: Payer: Medicare Other

## 2013-08-27 ENCOUNTER — Other Ambulatory Visit (INDEPENDENT_AMBULATORY_CARE_PROVIDER_SITE_OTHER): Payer: Medicare Other | Admitting: Internal Medicine

## 2013-08-27 ENCOUNTER — Ambulatory Visit: Payer: Medicare Other | Admitting: Internal Medicine

## 2013-08-27 DIAGNOSIS — R300 Dysuria: Secondary | ICD-10-CM

## 2013-08-27 LAB — POCT URINALYSIS DIPSTICK
Leukocytes, UA: NEGATIVE
Nitrite, UA: NEGATIVE
Urobilinogen, UA: NEGATIVE

## 2013-08-28 ENCOUNTER — Telehealth: Payer: Self-pay

## 2013-08-28 ENCOUNTER — Ambulatory Visit (INDEPENDENT_AMBULATORY_CARE_PROVIDER_SITE_OTHER): Payer: Medicare Other | Admitting: Internal Medicine

## 2013-08-28 ENCOUNTER — Encounter: Payer: Self-pay | Admitting: Internal Medicine

## 2013-08-28 VITALS — BP 160/90 | HR 76 | Temp 97.7°F | Wt 169.0 lb

## 2013-08-28 DIAGNOSIS — R4182 Altered mental status, unspecified: Secondary | ICD-10-CM

## 2013-08-28 DIAGNOSIS — C61 Malignant neoplasm of prostate: Secondary | ICD-10-CM

## 2013-08-28 DIAGNOSIS — R413 Other amnesia: Secondary | ICD-10-CM

## 2013-08-28 LAB — BASIC METABOLIC PANEL
BUN: 27 mg/dL — ABNORMAL HIGH (ref 6–23)
Creatinine, Ser: 1.1 mg/dL (ref 0.4–1.5)
GFR: 64.9 mL/min (ref >60.00)

## 2013-08-28 LAB — HEPATIC FUNCTION PANEL
ALT: 16 U/L (ref 0–53)
AST: 20 U/L (ref 0–37)
Albumin: 3.7 g/dL (ref 3.5–5.2)
Alkaline Phosphatase: 68 U/L (ref 39–117)
Bilirubin, Direct: 0.2 mg/dL (ref 0.0–0.3)
Total Bilirubin: 0.7 mg/dL (ref 0.3–1.2)
Total Protein: 6.4 g/dL (ref 6.0–8.3)

## 2013-08-28 LAB — CBC WITH DIFFERENTIAL/PLATELET
Basophils Absolute: 0 10*3/uL (ref 0.0–0.1)
Eosinophils Relative: 1.9 % (ref 0.0–5.0)
Lymphocytes Relative: 21.2 % (ref 12.0–46.0)
Monocytes Relative: 8.7 % (ref 3.0–12.0)
Platelets: 201 10*3/uL (ref 150.0–400.0)
RDW: 15 % — ABNORMAL HIGH (ref 11.5–14.6)
WBC: 8.9 10*3/uL (ref 4.5–10.5)

## 2013-08-28 LAB — TSH: TSH: 2.99 u[IU]/mL (ref 0.35–5.50)

## 2013-08-28 LAB — PSA: PSA: 34.79 ng/mL — ABNORMAL HIGH (ref 0.10–4.00)

## 2013-08-28 NOTE — Progress Notes (Signed)
Subjective:    Patient ID: Aaron Cervantes, male    DOB: 1917-07-16, 79 y.o.   MRN: [id number]  HPI Here with aide from [facility] Staff has noted some increased confusion Awakens in AM--doesn't know where he is Takes about 15 minutes and then does better  Urinalysis yesterday was fine No dysuria  No vision from left eye Right eye is okay--can still read, etc with this (I tested it)  No fever No cough or SOB No skin lesions  Arthritis pain has not been bad Hasn't had to ask for prn meds  Current Outpatient Prescriptions on File Prior to Visit  Medication Sig Dispense Refill  . acetaminophen (TYLENOL) 650 MG CR tablet Take 650 mg by mouth 3 (three) times daily.      Marland Kitchen allopurinol (ZYLOPRIM) 300 MG tablet Take 1 tablet (300 mg total) by mouth daily.  30 tablet  11  . aspirin 81 MG EC tablet TAKE 1 TABLET BY MOUTH EACH DAY. ANTI PLATELET AGGREGATION  120 tablet  PRN  . bicalutamide (CASODEX) 50 MG tablet Take 50 mg by mouth daily.        . bimatoprost (LUMIGAN) 0.03 % ophthalmic solution Place 1 drop into the left eye every morning.      . bisacodyl (DULCOLAX) 5 MG EC tablet Take 5 mg by mouth daily as needed.      . brimonidine-timolol (COMBIGAN) 0.2-0.5 % ophthalmic solution Place 1 drop into the left eye every 12 (twelve) hours.      Marland Kitchen losartan (COZAAR) 50 MG tablet TAKE (1) TABLET BY MOUTH ONCE A DAY. (HIGH BLOOD PRESSURE)  30 tablet  11  . Melatonin 3 MG TABS Take 1 tablet by mouth at bedtime.      Bertram Gala Glycol-Propyl Glycol (SYSTANE) 0.4-0.3 % SOLN Apply 1 drop to eye 2 (two) times daily. Both eyes      . polyethylene glycol (MIRALAX / GLYCOLAX) packet Take 17 g by mouth daily.      Marland Kitchen PRAVACHOL 20 MG tablet TAKE ONE TABLET BY MOUTH EACH DAY. (IMPROVES CHOLESTEROL)  30 tablet  11  . prednisoLONE acetate (PRED FORTE) 1 % ophthalmic suspension Place 1 drop into the left eye 4 (four) times daily.      . Tamsulosin HCl (FLOMAX) 0.4 MG CAPS Take 0.4 mg by mouth daily.      .  traMADol (ULTRAM) 50 MG tablet Take 0.5 tablets (25 mg total) by mouth 3 (three) times daily. And 25mg  three times daily prn  120 tablet  5  . [DISCONTINUED] losartan-hydrochlorothiazide (HYZAAR) 50-12.5 MG per tablet Take 1 tablet by mouth daily.         No current facility-administered medications on file prior to visit.    Allergies  Allergen Reactions  . Sulfonamide Derivatives     REACTION: unspecified    Past Medical History  Diagnosis Date  . Gout   . HTN (hypertension)   . TIA (transient ischemic attack) 1/08  . HLD (hyperlipidemia)   . Complete heart block     Dr. [place]  . Aortic stenosis   . AR (allergic rhinitis)   . Osteoarthritis   . Prostate cancer     Dr. Achilles Dunk    Past Surgical History  Procedure Laterality Date  . Right and left ih  2003  . 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 Echo and carotids  1/08    neg  . Pacemaker insertion  11/08  [employer]   . Cataract rt. eye  6/09    Family History  Problem Relation Age of Onset  . Diabetes      DM - GPs  . Colon cancer Neg Hx   . Prostate cancer Neg Hx     History   Social History  . Marital Status: Married    Spouse Name: N/A    Number of Children: 3  . Years of Education: N/A   Occupational History  . Forensic scientist      Retired   Social History Main Topics  . Smoking status: Never Smoker   . Smokeless tobacco: Never Used  . Alcohol Use: Yes  . Drug Use: No  . Sexual Activity: Not on file   Other Topics Concern  . Not on file   Social History Narrative   Married (2nd), 3 children   Enjoys bridge and Dance movement psychotherapist.    Regular exercise --fitness center 3 days per week. Considering starting in pool      Has living will   Requests DIL [name] to be health care POA   Has DNR---done 08/14/12   No feeding tube if cognitively unaware         Review of Systems Still hard of hearing Appetite is fair Weight is stable    Objective:   Physical Exam  Constitutional: He  appears well-developed and well-nourished. No distress.  Neck: Normal range of motion. Neck supple.  Cardiovascular: Normal rate and regular rhythm.  Exam reveals no gallop.   Murmur heard. Soft systolic murmur  Pulmonary/Chest: Effort normal and breath sounds normal. No respiratory distress. He has no wheezes. He has no rales.  Abdominal: Soft. There is no tenderness.  Musculoskeletal: He exhibits no edema and no tenderness.  Lymphadenopathy:    He has no cervical adenopathy.  Neurological: He is alert.  Lost focus with questions--- thought it was July 6th [facility]" Repeated himself at times  Psychiatric: His behavior is normal.  States some depression for 3-4 weeks Does note some anhedonia -- "I feel a little unnecessary"          Assessment & Plan:

## 2013-08-28 NOTE — Telephone Encounter (Signed)
He already has it and I just decreased the dose, so I didn't think that was necessary  #45 x 5 if they need a number

## 2013-08-28 NOTE — Assessment & Plan Note (Signed)
Will check PSA and send to Dr Cope---to make sure the cancer isn't part of the problem

## 2013-08-28 NOTE — Assessment & Plan Note (Signed)
Not delerium No clear signs of infection Mood is off but may be related to his confusion  Will stop statin Stop tramadol (just have prn) Check labs Consider antidepressant but his mood seemed fine until all the problems with his eye

## 2013-08-28 NOTE — Telephone Encounter (Signed)
Tammy at [facility] left v/m requesting cb to [place] or Morrie Sheldon at [phone] to clarify orders to d/c pravachol and Tramadol changed to 25 mg tid prn. Tammy needs quantity on Tramadol and # of refills.Please advise.

## 2013-08-29 LAB — URINE CULTURE
Colony Count: NO GROWTH
Organism ID, Bacteria: NO GROWTH

## 2013-09-03 ENCOUNTER — Ambulatory Visit: Payer: Self-pay | Admitting: Internal Medicine

## 2013-09-03 ENCOUNTER — Encounter: Payer: Self-pay | Admitting: Internal Medicine

## 2013-09-04 ENCOUNTER — Encounter: Payer: Self-pay | Admitting: *Deleted

## 2013-09-17 ENCOUNTER — Non-Acute Institutional Stay: Payer: Medicare Other | Admitting: Internal Medicine

## 2013-09-17 ENCOUNTER — Encounter: Payer: Medicare Other | Admitting: Internal Medicine

## 2013-09-17 ENCOUNTER — Encounter: Payer: Self-pay | Admitting: Internal Medicine

## 2013-09-17 VITALS — BP 130/68 | HR 74 | Temp 98.1°F | Resp 20 | Wt 167.0 lb

## 2013-09-17 DIAGNOSIS — C61 Malignant neoplasm of prostate: Secondary | ICD-10-CM

## 2013-09-17 DIAGNOSIS — B351 Tinea unguium: Secondary | ICD-10-CM

## 2013-09-17 NOTE — Assessment & Plan Note (Signed)
Nails trimmed  Tolerated well

## 2013-09-17 NOTE — Assessment & Plan Note (Signed)
Discussed alternatives with hormone resistant prostate cancer He voices satisfaction with his life--- "I will soon be 57 and I have done a lot" He prefers no further follow up--even with Dr Achilles Dunk Will reevaluate this over time but clearly we don't want to pursue second opinion

## 2013-09-17 NOTE — Progress Notes (Signed)
Subjective:    Patient ID: Aaron Cervantes, male    DOB: September 08, 1917, 79 y.o.   MRN: [id number]  HPI Reviewed status with Katie RN  Needs toenails trimmed No pain or inflammation  AM confusion is better Still up frequently in bed---has to just reposition  Discussed the prostate cancer again He doesn't want to pursue further evaluation  Current Outpatient Prescriptions on File Prior to Visit  Medication Sig Dispense Refill  . acetaminophen (TYLENOL) 650 MG CR tablet Take 650 mg by mouth 3 (three) times daily.      Marland Kitchen allopurinol (ZYLOPRIM) 300 MG tablet Take 1 tablet (300 mg total) by mouth daily.  30 tablet  11  . aspirin 81 MG EC tablet TAKE 1 TABLET BY MOUTH EACH DAY. ANTI PLATELET AGGREGATION  120 tablet  PRN  . bicalutamide (CASODEX) 50 MG tablet Take 50 mg by mouth daily.        . bimatoprost (LUMIGAN) 0.03 % ophthalmic solution Place 1 drop into the left eye every morning.      . bisacodyl (DULCOLAX) 5 MG EC tablet Take 5 mg by mouth daily as needed.      . brimonidine-timolol (COMBIGAN) 0.2-0.5 % ophthalmic solution Place 1 drop into the left eye every 12 (twelve) hours.      Marland Kitchen losartan (COZAAR) 50 MG tablet TAKE (1) TABLET BY MOUTH ONCE A DAY. (HIGH BLOOD PRESSURE)  30 tablet  11  . Melatonin 3 MG TABS Take 1 tablet by mouth at bedtime.      Bertram Gala Glycol-Propyl Glycol (SYSTANE) 0.4-0.3 % SOLN Apply 1 drop to eye 2 (two) times daily. Both eyes      . polyethylene glycol (MIRALAX / GLYCOLAX) packet Take 17 g by mouth daily.      . Tamsulosin HCl (FLOMAX) 0.4 MG CAPS Take 0.4 mg by mouth daily.      . traMADol (ULTRAM) 50 MG tablet Take 25 mg by mouth 3 (three) times daily as needed.      . [DISCONTINUED] losartan-hydrochlorothiazide (HYZAAR) 50-12.5 MG per tablet Take 1 tablet by mouth daily.         No current facility-administered medications on file prior to visit.    Allergies  Allergen Reactions  . Sulfonamide Derivatives     REACTION: unspecified    Past  Medical History  Diagnosis Date  . Gout   . HTN (hypertension)   . TIA (transient ischemic attack) 1/08  . HLD (hyperlipidemia)   . Complete heart block     Dr. [place]  . Aortic stenosis   . AR (allergic rhinitis)   . Osteoarthritis   . Prostate cancer     Dr. Achilles Dunk    Past Surgical History  Procedure Laterality Date  . Right and left ih  2003  . 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 Echo and carotids  1/08    neg  . Pacemaker insertion  11/08    [employer]   . Cataract rt. eye  6/09    Family History  Problem Relation Age of Onset  . Diabetes      DM - GPs  . Colon cancer Neg Hx   . Prostate cancer Neg Hx     History   Social History  . Marital Status: Married    Spouse Name: N/A    Number of Children: 3  . Years of Education: N/A   Occupational History  . Forensic scientist  Retired   Social History Main Topics  . Smoking status: Never Smoker   . Smokeless tobacco: Never Used  . Alcohol Use: Yes  . Drug Use: No  . Sexual Activity: Not on file   Other Topics Concern  . Not on file   Social History Narrative   Married (2nd), 3 children   Enjoys bridge and Dance movement psychotherapist.    Regular exercise --fitness center 3 days per week. Considering starting in pool      Has living will   Requests DIL [name] to be health care POA   Has DNR---done 08/14/12   No feeding tube if cognitively unaware         Review of Systems Appetite is okay Weight is stable    Objective:   Physical Exam  Constitutional: He appears well-developed. No distress.  Musculoskeletal:  Thick mycotic toenails  Psychiatric: He has a normal mood and affect. His behavior is normal.          Assessment & Plan:

## 2013-10-07 ENCOUNTER — Other Ambulatory Visit: Payer: Self-pay | Admitting: *Deleted

## 2013-10-07 NOTE — Telephone Encounter (Signed)
Ok to fill? This rx came from [facility]

## 2013-10-08 NOTE — Telephone Encounter (Signed)
No  Have Mandy check with his eye doctors at [facility] about this

## 2013-10-08 NOTE — Telephone Encounter (Signed)
Spoke with [facility] and advised results, this was already handled by Dr. Patrica Duel @ [facility]

## 2013-10-19 ENCOUNTER — Ambulatory Visit: Payer: Self-pay | Admitting: Podiatry

## 2013-11-12 ENCOUNTER — Non-Acute Institutional Stay: Payer: Medicare Other | Admitting: Internal Medicine

## 2013-11-12 ENCOUNTER — Encounter: Payer: Self-pay | Admitting: Internal Medicine

## 2013-11-12 VITALS — BP 120/70 | HR 72 | Temp 97.3°F | Resp 20 | Wt 171.0 lb

## 2013-11-12 DIAGNOSIS — I10 Essential (primary) hypertension: Secondary | ICD-10-CM

## 2013-11-12 DIAGNOSIS — I359 Nonrheumatic aortic valve disorder, unspecified: Secondary | ICD-10-CM

## 2013-11-12 DIAGNOSIS — M545 Low back pain: Secondary | ICD-10-CM

## 2013-11-12 DIAGNOSIS — C61 Malignant neoplasm of prostate: Secondary | ICD-10-CM

## 2013-11-12 NOTE — Assessment & Plan Note (Signed)
BP Readings from Last 3 Encounters:  11/12/13 120/70  09/17/13 130/68  08/28/13 160/90   Good control Would cut dose if any dizziness (may need to keep systolic BP up with the AS)

## 2013-11-12 NOTE — Assessment & Plan Note (Signed)
PSA up but no rapid progression Is proceeding with lupron and considering other more aggressive Rx

## 2013-11-12 NOTE — Progress Notes (Signed)
Subjective:    Patient ID: Aaron Cervantes, male    DOB: 1917-09-11, 79 y.o.   MRN: [id number]  HPI Reviewed status with Angelica Chessman RN  Having ongoing eye problems Glaucoma and recurrent infection Has intensive regimen of drops for his glaucoma Following at [facility] for this  Ongoing care from Dr Achilles Dunk for his prostate cancer Getting lupron shots Considering Rx with zytiga as well Voiding okay-- "it builds up a bit"-- some urgency and slow stream Nocturia x 1  Confusion is gone No disorientation in the morning like he had briefly Mild balance issues---walks okay with rollator walker Strength is good  No chest pain No SOB No edema but slight calf swelling  Current Outpatient Prescriptions on File Prior to Visit  Medication Sig Dispense Refill  . acetaminophen (TYLENOL) 650 MG CR tablet Take 650 mg by mouth 3 (three) times daily.      Marland Kitchen allopurinol (ZYLOPRIM) 300 MG tablet Take 1 tablet (300 mg total) by mouth daily.  30 tablet  11  . aspirin 81 MG EC tablet TAKE 1 TABLET BY MOUTH EACH DAY. ANTI PLATELET AGGREGATION  120 tablet  PRN  . bicalutamide (CASODEX) 50 MG tablet Take 50 mg by mouth daily.        . bimatoprost (LUMIGAN) 0.03 % ophthalmic solution Place 1 drop into the left eye every morning.      . bisacodyl (DULCOLAX) 5 MG EC tablet Take 5 mg by mouth daily as needed.      . brimonidine-timolol (COMBIGAN) 0.2-0.5 % ophthalmic solution Place 1 drop into the left eye every 12 (twelve) hours.      Marland Kitchen erythromycin ophthalmic ointment Place 1 application into the left eye 4 (four) times daily.       Marland Kitchen losartan (COZAAR) 50 MG tablet TAKE (1) TABLET BY MOUTH ONCE A DAY. (HIGH BLOOD PRESSURE)  30 tablet  11  . Melatonin 3 MG TABS Take 1 tablet by mouth at bedtime.      Bertram Gala Glycol-Propyl Glycol (SYSTANE) 0.4-0.3 % SOLN Apply 1 drop to eye 2 (two) times daily. Both eyes      . polyethylene glycol (MIRALAX / GLYCOLAX) packet Take 17 g by mouth daily.      . Tamsulosin HCl (FLOMAX)  0.4 MG CAPS Take 0.4 mg by mouth daily.      . traMADol (ULTRAM) 50 MG tablet Take 25 mg by mouth 3 (three) times daily as needed.      . [DISCONTINUED] losartan-hydrochlorothiazide (HYZAAR) 50-12.5 MG per tablet Take 1 tablet by mouth daily.         No current facility-administered medications on file prior to visit.    Allergies  Allergen Reactions  . Sulfonamide Derivatives     REACTION: unspecified    Past Medical History  Diagnosis Date  . Gout   . HTN (hypertension)   . TIA (transient ischemic attack) 1/08  . HLD (hyperlipidemia)   . Complete heart block     Dr. [place]  . Aortic stenosis   . AR (allergic rhinitis)   . Osteoarthritis   . Prostate cancer     Dr. Achilles Dunk    Past Surgical History  Procedure Laterality Date  . Right and left ih  2003  . 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 Echo and carotids  1/08    neg  . Pacemaker insertion  11/08    [employer]   . Cataract rt. eye  6/09  Family History  Problem Relation Age of Onset  . Diabetes      DM - GPs  . Colon cancer Neg Hx   . Prostate cancer Neg Hx     History   Social History  . Marital Status: Married    Spouse Name: N/A    Number of Children: 3  . Years of Education: N/A   Occupational History  . Forensic scientist      Retired   Social History Main Topics  . Smoking status: Never Smoker   . Smokeless tobacco: Never Used  . Alcohol Use: Yes  . Drug Use: No  . Sexual Activity: Not on file   Other Topics Concern  . Not on file   Social History Narrative   Married (2nd), 3 children   Enjoys bridge and Dance movement psychotherapist.    Regular exercise --fitness center 3 days per week. Considering starting in pool      Has living will   Requests DIL [name] to be health care POA   Has DNR---done 08/14/12   No feeding tube if cognitively unaware         Review of Systems Appetite is okay Weight is up a few pounds Sleeps okay with the melatonin Occasional soreness in left abdomen--not  persistent    Objective:   Physical Exam  Constitutional: He appears well-developed and well-nourished. No distress.  Neck: Normal range of motion. Neck supple. No thyromegaly present.  Cardiovascular: Normal rate.  Exam reveals no gallop.   Murmur heard. Extra beats  Gr 2/6 aortic systolic murmur  Pulmonary/Chest: Effort normal and breath sounds normal. No respiratory distress. He has no wheezes. He has no rales.  Abdominal: Soft. There is no tenderness.  Musculoskeletal: He exhibits no edema and no tenderness.  Lymphadenopathy:    He has no cervical adenopathy.  Neurological: He is alert.  Engages normally  Psychiatric: He has a normal mood and affect. His behavior is normal.  Mood seems fine Does recount his fears after urology appointment that his life was near its end ---reassured him it seems his cancer is slow          Assessment & Plan:

## 2013-11-12 NOTE — Assessment & Plan Note (Signed)
Does okay with current regimen 

## 2013-11-12 NOTE — Assessment & Plan Note (Signed)
No chest pain or dizziness No Rx

## 2013-11-17 ENCOUNTER — Other Ambulatory Visit: Payer: Self-pay | Admitting: *Deleted

## 2013-11-17 NOTE — Telephone Encounter (Signed)
Denial sent back to pharmacy. 

## 2013-11-17 NOTE — Telephone Encounter (Signed)
He needs to get this from his eye doctors---on a very complicated regimen (I think at [place], or [facility])

## 2013-11-17 NOTE — Telephone Encounter (Signed)
Ok to fill 

## 2013-11-24 ENCOUNTER — Other Ambulatory Visit: Payer: Self-pay | Admitting: *Deleted

## 2013-11-24 MED ORDER — MOXIFLOXACIN HCL 0.5 % OP SOLN: OPHTHALMIC | Status: AC

## 2013-12-01 ENCOUNTER — Encounter: Payer: Medicare Other | Admitting: Internal Medicine

## 2013-12-23 ENCOUNTER — Telehealth: Payer: Self-pay

## 2013-12-23 NOTE — Telephone Encounter (Signed)
Received a request for a refill on Sodium Chloride 5% eye OI 3.5gm--directions--Use in Right eye at bedtime... I could not match with any of the other Rx in the chart... Please advise

## 2013-12-23 NOTE — Telephone Encounter (Signed)
Left message on mandy's VM with results

## 2013-12-23 NOTE — Telephone Encounter (Signed)
Please call Angelica Chessman or Florentina Addison This should come from his eye doctors---on very complicated regimen

## 2014-01-05 ENCOUNTER — Encounter: Payer: Medicare Other | Admitting: Internal Medicine

## 2014-01-19 ENCOUNTER — Ambulatory Visit (INDEPENDENT_AMBULATORY_CARE_PROVIDER_SITE_OTHER): Payer: Medicare Other | Admitting: Internal Medicine

## 2014-01-19 ENCOUNTER — Encounter: Payer: Medicare Other | Admitting: Internal Medicine

## 2014-01-19 ENCOUNTER — Encounter: Payer: Self-pay | Admitting: Internal Medicine

## 2014-01-19 VITALS — BP 109/71 | HR 82 | Ht 70.0 in | Wt 171.0 lb

## 2014-01-19 DIAGNOSIS — I442 Atrioventricular block, complete: Secondary | ICD-10-CM

## 2014-01-19 DIAGNOSIS — I10 Essential (primary) hypertension: Secondary | ICD-10-CM

## 2014-01-19 LAB — MDC_IDC_ENUM_SESS_TYPE_INCLINIC
Battery Impedance: 814 Ohm
Battery Voltage: 2.77 V
Brady Statistic AP VP Percent: 37 %
Brady Statistic AP VS Percent: 0 %
Brady Statistic AS VS Percent: 0 %
Date Time Interrogation Session: 20150127104217
Lead Channel Impedance Value: 522 Ohm
Lead Channel Impedance Value: 588 Ohm
Lead Channel Pacing Threshold Amplitude: 1.25 V
Lead Channel Pacing Threshold Pulse Width: 0.4 ms
Lead Channel Pacing Threshold Pulse Width: 0.4 ms
Lead Channel Sensing Intrinsic Amplitude: 0.7 mV
Lead Channel Setting Pacing Amplitude: 2.5 V
Lead Channel Setting Sensing Sensitivity: 4 mV
MDC IDC MSMT BATTERY REMAINING LONGEVITY: 49 mo
MDC IDC MSMT LEADCHNL RV PACING THRESHOLD AMPLITUDE: 0.5 V
MDC IDC MSMT LEADCHNL RV SENSING INTR AMPL: 11.2 mV
MDC IDC SET LEADCHNL RA PACING AMPLITUDE: 2.5 V
MDC IDC SET LEADCHNL RV PACING PULSEWIDTH: 0.4 ms
MDC IDC STAT BRADY AS VP PERCENT: 63 %

## 2014-01-19 NOTE — Progress Notes (Signed)
Patient Care Team: Venia Carbon, MD as PCP - General (Pediatrics)   HPI  Aaron Cervantes is a 80 y.o. male Seen in pacemaker followup. this was implanted in 2008 by Dr. Verlon Setting at the request of Dr. Martinique for complete heart block. He received a Medtronic dual-chamber device.  He underwent an echo at that time demonstrating normal left ventricular function   Exercise tolerance is relatively stable; he has some mild peripheral edema   Ongoing issues include prostate cancer with an increasing PSA most recently 60 and glaucoma.  He reads the [place] times times daily   Past Medical History  Diagnosis Date  . Gout   . HTN (hypertension)   . TIA (transient ischemic attack) 1/08  . HLD (hyperlipidemia)   . Complete heart block     Dr. Martinique  . Aortic stenosis   . AR (allergic rhinitis)   . Osteoarthritis   . Prostate cancer     Dr. Jacqlyn Larsen  . Glaucoma     left eye    Past Surgical History  Procedure Laterality Date  . Right and left ih  2003  . 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 Echo and carotids  1/08    neg  . Pacemaker insertion  11/08    Medtronic   . Cataract rt. eye  6/09    Current Outpatient Prescriptions  Medication Sig Dispense Refill  . acetaminophen (TYLENOL) 650 MG CR tablet Take 650 mg by mouth 3 (three) times daily.      Marland Kitchen allopurinol (ZYLOPRIM) 300 MG tablet Take 1 tablet (300 mg total) by mouth daily.  30 tablet  11  . aspirin 81 MG EC tablet TAKE 1 TABLET BY MOUTH EACH DAY. ANTI PLATELET AGGREGATION  120 tablet  PRN  . bicalutamide (CASODEX) 50 MG tablet Take 50 mg by mouth daily.        . bimatoprost (LUMIGAN) 0.03 % ophthalmic solution Place 1 drop into the left eye every morning.      . bisacodyl (DULCOLAX) 5 MG EC tablet Take 5 mg by mouth daily as needed.      . brimonidine-timolol (COMBIGAN) 0.2-0.5 % ophthalmic solution Place 1 drop into the left eye every 12 (twelve) hours.      . clobetasol ointment (TEMOVATE) 9.70 %  Apply 1 application topically daily.       Marland Kitchen erythromycin ophthalmic ointment Place 1 application into the left eye 4 (four) times daily.       Marland Kitchen losartan (COZAAR) 50 MG tablet TAKE (1) TABLET BY MOUTH ONCE A DAY. (HIGH BLOOD PRESSURE)  30 tablet  11  . Melatonin 3 MG TABS Take 1 tablet by mouth at bedtime.      . moxifloxacin (VIGAMOX) 0.5 % ophthalmic solution Place one drop into affected eye every hour while awake  3 mL  2  . Polyethyl Glycol-Propyl Glycol (SYSTANE) 0.4-0.3 % SOLN Apply 1 drop to eye 2 (two) times daily. Both eyes      . polyethylene glycol (MIRALAX / GLYCOLAX) packet Take 17 g by mouth daily.      . predniSONE (DELTASONE) 5 MG tablet Take 5 mg by mouth 2 (two) times daily with a meal.       . Tamsulosin HCl (FLOMAX) 0.4 MG CAPS Take 0.4 mg by mouth daily.      . traMADol (ULTRAM) 50 MG tablet Take 25 mg by mouth 3 (three) times daily as  needed.      . [DISCONTINUED] losartan-hydrochlorothiazide (HYZAAR) 50-12.5 MG per tablet Take 1 tablet by mouth daily.         No current facility-administered medications for this visit.    Allergies  Allergen Reactions  . Sulfonamide Derivatives     REACTION: unspecified    Review of Systems negative except from HPI and PMH  Physical Exam BP 109/71  Pulse 82  Ht 5\' 10"  (1.778 m)  Wt 171 lb (77.565 kg)  BMI 24.54 kg/m2 Well developed and nourished in no acute distress drainiage from left eye Neck supple  Device pocket well healed; without hematoma or erythema.  There is no tethering  Clear Regular rate and rhythm, no murmurs or gallops Abd-soft with active BS No Clubbing cyanosis edema Skin-warm and dry A & Oriented  Grossly normal sensory and motor function  wals w walker     Assessment and  Plan

## 2014-01-19 NOTE — Assessment & Plan Note (Signed)
Stable post pacing 

## 2014-01-19 NOTE — Assessment & Plan Note (Signed)
The patient's device was interrogated.  The information was reviewed. No changes were made in the programming.    

## 2014-01-19 NOTE — Patient Instructions (Signed)
Your physician wants you to follow-up in: 12 months. You will receive a reminder letter in the mail two months in advance. If you don't receive a letter, please call our office to schedule the follow-up appointment.  Please follow up with device clinic in 6 months.

## 2014-02-08 ENCOUNTER — Telehealth: Payer: Self-pay

## 2014-02-08 NOTE — Telephone Encounter (Signed)
Aaron Cervantes with Dr Cope's office is on phone about Dr Silvio Pate stopping prednisone; advised Aaron Cervantes to call [facility] and Aaron Cervantes has already called [facility] and was advised to call Dr Silvio Pate; transferred call to [facility]

## 2014-02-08 NOTE — Telephone Encounter (Signed)
Advised her again to call [facility] about a medication that was stopped on Friday, advised pt is seen at [facility]

## 2014-02-16 ENCOUNTER — Encounter: Payer: Self-pay | Admitting: Podiatry

## 2014-02-17 ENCOUNTER — Ambulatory Visit (INDEPENDENT_AMBULATORY_CARE_PROVIDER_SITE_OTHER): Payer: Medicare Other | Admitting: Podiatry

## 2014-02-17 VITALS — BP 100/64 | HR 74 | Resp 20

## 2014-02-17 DIAGNOSIS — B351 Tinea unguium: Secondary | ICD-10-CM

## 2014-02-17 DIAGNOSIS — M79609 Pain in unspecified limb: Secondary | ICD-10-CM

## 2014-02-17 NOTE — Progress Notes (Signed)
Trim toenails because they're painful they hurt when I walk.  Objective: Vital signs are stable alert and oriented x3. Pulses are palpable bilateral. Nails are thick yellow dystrophic onychomycotic and painful palpation.  Assessment: Pain in limb secondary to onychomycosis 1 through 5 bilateral.  Plan: Debridement of nails 1 through 5 bilateral secondary to pain.

## 2014-02-18 ENCOUNTER — Non-Acute Institutional Stay: Payer: Medicare Other | Admitting: Internal Medicine

## 2014-02-18 ENCOUNTER — Encounter: Payer: Self-pay | Admitting: Internal Medicine

## 2014-02-18 VITALS — BP 102/70 | HR 72 | Temp 98.0°F | Resp 16 | Wt 172.0 lb

## 2014-02-18 DIAGNOSIS — C61 Malignant neoplasm of prostate: Secondary | ICD-10-CM

## 2014-02-18 DIAGNOSIS — I359 Nonrheumatic aortic valve disorder, unspecified: Secondary | ICD-10-CM

## 2014-02-18 DIAGNOSIS — I10 Essential (primary) hypertension: Secondary | ICD-10-CM

## 2014-02-18 DIAGNOSIS — R609 Edema, unspecified: Secondary | ICD-10-CM

## 2014-02-18 NOTE — Assessment & Plan Note (Signed)
On aggressive Rx by Dr Jacqlyn Larsen Hopefully he is responding

## 2014-02-18 NOTE — Assessment & Plan Note (Signed)
Doesn't seem to have symptoms from this

## 2014-02-18 NOTE — Assessment & Plan Note (Signed)
BP Readings from Last 3 Encounters:  02/18/14 102/70  02/17/14 100/64  01/19/14 109/71   Good control now Will try off the losartan

## 2014-02-18 NOTE — Assessment & Plan Note (Signed)
Mild Not enough to be a limiting factor even if related to his prostate cancer Rx Definitely no evidence of CHF More likely venous insufficiency Discussed elevation with him

## 2014-02-18 NOTE — Progress Notes (Signed)
Subjective:    Patient ID: Aaron Cervantes, male    DOB: [birth year], 80 y.o.   MRN: [id number]  HPI Reviewed status with Leafy Ro RN Had been started on zytiga and prednisone for his resistant prostate cancer Developed edema--so meds briefly stopped. No help so they have been restarted BNP only 186---so not likely to be from CHF Did have urology follow up recently for bladder scan---residual only 209  He is not really concerned about the swelling Recently had toenails cut No open areas---does have some discolored spots though (and scattered little ecchymotic areas) "I feel pretty good!"  No chest pain No SOB Still walks well with rollator Sleeps flat, no PND No dizziness or syncope  Current Outpatient Prescriptions on File Prior to Visit  Medication Sig Dispense Refill  . acetaminophen (TYLENOL) 650 MG CR tablet Take 650 mg by mouth 3 (three) times daily.      Marland Kitchen allopurinol (ZYLOPRIM) 300 MG tablet Take 1 tablet (300 mg total) by mouth daily.  30 tablet  11  . aspirin 81 MG EC tablet TAKE 1 TABLET BY MOUTH EACH DAY. ANTI PLATELET AGGREGATION  120 tablet  PRN  . bicalutamide (CASODEX) 50 MG tablet Take 50 mg by mouth daily.        . bimatoprost (LUMIGAN) 0.03 % ophthalmic solution Place 1 drop into the left eye every morning.      . bisacodyl (DULCOLAX) 5 MG EC tablet Take 5 mg by mouth daily as needed.      . brimonidine-timolol (COMBIGAN) 0.2-0.5 % ophthalmic solution Place 1 drop into the left eye every 12 (twelve) hours.      . clobetasol ointment (TEMOVATE) 1.19 % Apply 1 application topically daily.       . clobetasol ointment (TEMOVATE) 0.05 % Apply topically.      Marland Kitchen erythromycin ophthalmic ointment Place 1 application into the left eye 4 (four) times daily.       Marland Kitchen losartan (COZAAR) 50 MG tablet TAKE (1) TABLET BY MOUTH ONCE A DAY. (HIGH BLOOD PRESSURE)  30 tablet  11  . Melatonin 3 MG TABS Take 1 tablet by mouth at bedtime.      . moxifloxacin (VIGAMOX) 0.5 % ophthalmic  solution Place one drop into affected eye every hour while awake  3 mL  2  . Polyethyl Glycol-Propyl Glycol (SYSTANE) 0.4-0.3 % SOLN Apply 1 drop to eye 2 (two) times daily. Both eyes      . polyethylene glycol (MIRALAX / GLYCOLAX) packet Take 17 g by mouth daily.      . predniSONE (DELTASONE) 5 MG tablet Take 5 mg by mouth 2 (two) times daily with a meal.       . Tamsulosin HCl (FLOMAX) 0.4 MG CAPS Take 0.4 mg by mouth daily.      . traMADol (ULTRAM) 50 MG tablet Take 25 mg by mouth 3 (three) times daily as needed.      Marland Kitchen ZYTIGA 250 MG tablet Take 1,000 mg by mouth daily.       . [DISCONTINUED] losartan-hydrochlorothiazide (HYZAAR) 50-12.5 MG per tablet Take 1 tablet by mouth daily.         No current facility-administered medications on file prior to visit.    Allergies  Allergen Reactions  . Sulfonamide Derivatives     REACTION: unspecified    Past Medical History  Diagnosis Date  . Gout   . HTN (hypertension)   . TIA (transient ischemic attack) 1/08  . HLD (hyperlipidemia)   .  Complete heart block     Dr. Martinique  . Aortic stenosis   . AR (allergic rhinitis)   . Osteoarthritis   . Prostate cancer     Dr. Jacqlyn Larsen  . Glaucoma     left eye    Past Surgical History  Procedure Laterality Date  . Right and left ih  2003  . 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 Echo and carotids  1/08    neg  . Pacemaker insertion  11/08    Medtronic   . Cataract rt. eye  6/09    Family History  Problem Relation Age of Onset  . Diabetes      DM - [facility]  . Colon cancer Neg Hx   . Prostate cancer Neg Hx     History   Social History  . Marital Status: Married    Spouse Name: N/A    Number of Children: 3  . Years of Education: N/A   Occupational History  . Social research officer, government      Retired   Social History Main Topics  . Smoking status: Never Smoker   . Smokeless tobacco: Never Used  . Alcohol Use: No  . Drug Use: No  . Sexual Activity: Not on file   Other Topics  Concern  . Not on file   Social History Narrative   Married (2nd), 3 children   Enjoys bridge and Chief Strategy Officer.    Regular exercise --fitness center 3 days per week. Considering starting in pool      Has living will   Requests DIL Earlyne Iba to be health care POA   Has DNR---done 08/14/12   No feeding tube if cognitively unaware         Review of Systems Still gets some right flank soreness at night--thinks it may be gas. Usually gets up to void and notices it Aching does get better if he passes gas Bowels have been fine with the miralax Appetite is okay Weight is stable    Objective:   Physical Exam  Constitutional: He appears well-developed and well-nourished. No distress.  Neck: Normal range of motion. Neck supple. No thyromegaly present.  Cardiovascular: Normal rate, regular rhythm and intact distal pulses.  Exam reveals no gallop.   Murmur heard. Soft aortic systolic murmur  Pulmonary/Chest: Effort normal and breath sounds normal. No respiratory distress. He has no wheezes. He has no rales.  Abdominal: Soft. He exhibits no distension. There is no tenderness. There is no rebound.  Musculoskeletal:  1-2+ pitting edema in ankles, 1+ in feet. Calves are fine No joint tenderness  Lymphadenopathy:    He has no cervical adenopathy.  Skin:  Scattered small ecchymotic areas on legs  Psychiatric: He has a normal mood and affect. His behavior is normal.          Assessment & Plan:

## 2014-03-18 ENCOUNTER — Encounter: Payer: Self-pay | Admitting: Internal Medicine

## 2014-03-18 ENCOUNTER — Non-Acute Institutional Stay: Payer: Medicare Other | Admitting: Internal Medicine

## 2014-03-18 VITALS — BP 130/84 | HR 84 | Temp 98.4°F | Resp 16 | Wt 173.0 lb

## 2014-03-18 DIAGNOSIS — R1012 Left upper quadrant pain: Secondary | ICD-10-CM

## 2014-03-18 DIAGNOSIS — C61 Malignant neoplasm of prostate: Secondary | ICD-10-CM

## 2014-03-18 DIAGNOSIS — C7952 Secondary malignant neoplasm of bone marrow: Secondary | ICD-10-CM

## 2014-03-18 DIAGNOSIS — C7951 Secondary malignant neoplasm of bone: Secondary | ICD-10-CM

## 2014-03-18 NOTE — Assessment & Plan Note (Signed)
PSA rising Known pelvic abnormality but no specific bony pain Not clear if current left side discomfort is cancer related Continues on rescue Rx with zytiga and prednisone I don't think his discomfort is related to the treatment  Discussed that if he declines--will need to move to healthcare He is obviously not excited about that possibility

## 2014-03-18 NOTE — Progress Notes (Signed)
Subjective:    Patient ID: Aaron Cervantes, male    DOB: March 18, 1917, 80 y.o.   MRN: [id number]  HPI Asked to see earlier due to problems Leafy Ro concerned about abdominal discomfort and bloating  He states "I am falling apart--- I don't know what is going on" More trouble moving apart--feels weaker for over a week  Has some LUQ abdominal pain---only with movement though Concerned about it being gas mylanta did give some relief Doesn't feel it is related to meals The pain goes up into his left chest  Gets up in AM and has to void repetitively (in and out of bed for a while) No dysuria  No memory problems Still able to do personal care and get down for meals  Current Outpatient Prescriptions on File Prior to Visit  Medication Sig Dispense Refill  . acetaminophen (TYLENOL) 650 MG CR tablet Take 650 mg by mouth 3 (three) times daily.      Marland Kitchen allopurinol (ZYLOPRIM) 300 MG tablet Take 1 tablet (300 mg total) by mouth daily.  30 tablet  11  . aspirin 81 MG EC tablet TAKE 1 TABLET BY MOUTH EACH DAY. ANTI PLATELET AGGREGATION  120 tablet  PRN  . bicalutamide (CASODEX) 50 MG tablet Take 50 mg by mouth daily.        . bimatoprost (LUMIGAN) 0.03 % ophthalmic solution Place 1 drop into the left eye every morning.      . bisacodyl (DULCOLAX) 5 MG EC tablet Take 5 mg by mouth daily as needed.      . brimonidine-timolol (COMBIGAN) 0.2-0.5 % ophthalmic solution Place 1 drop into the left eye every 12 (twelve) hours.      . clobetasol ointment (TEMOVATE) 0.05 % Apply topically.      Marland Kitchen erythromycin ophthalmic ointment Place 1 application into the left eye 4 (four) times daily.       . Melatonin 3 MG TABS Take 1 tablet by mouth at bedtime.      . moxifloxacin (VIGAMOX) 0.5 % ophthalmic solution Place one drop into affected eye every hour while awake  3 mL  2  . Polyethyl Glycol-Propyl Glycol (SYSTANE) 0.4-0.3 % SOLN Apply 1 drop to eye 2 (two) times daily. Both eyes      . polyethylene glycol  (MIRALAX / GLYCOLAX) packet Take 17 g by mouth daily.      . predniSONE (DELTASONE) 5 MG tablet Take 5 mg by mouth 2 (two) times daily with a meal.       . Tamsulosin HCl (FLOMAX) 0.4 MG CAPS Take 0.4 mg by mouth 2 (two) times daily.       . traMADol (ULTRAM) 50 MG tablet Take 25 mg by mouth 3 (three) times daily as needed.      Marland Kitchen ZYTIGA 250 MG tablet Take 1,000 mg by mouth daily.       . [DISCONTINUED] losartan-hydrochlorothiazide (HYZAAR) 50-12.5 MG per tablet Take 1 tablet by mouth daily.         No current facility-administered medications on file prior to visit.    Allergies  Allergen Reactions  . Sulfonamide Derivatives     REACTION: unspecified    Past Medical History  Diagnosis Date  . Gout   . HTN (hypertension)   . TIA (transient ischemic attack) 1/08  . HLD (hyperlipidemia)   . Complete heart block     Dr. Martinique  . Aortic stenosis   . AR (allergic rhinitis)   . Osteoarthritis   . Prostate  cancer     Dr. Jacqlyn Larsen  . Glaucoma     left eye    Past Surgical History  Procedure Laterality Date  . Right and left ih  2003  . 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 Echo and carotids  1/08    neg  . Pacemaker insertion  11/08    Medtronic   . Cataract rt. eye  6/09    Family History  Problem Relation Age of Onset  . Diabetes      DM - [facility]  . Colon cancer Neg Hx   . Prostate cancer Neg Hx     History   Social History  . Marital Status: Married    Spouse Name: N/A    Number of Children: 3  . Years of Education: N/A   Occupational History  . Social research officer, government      Retired   Social History Main Topics  . Smoking status: Never Smoker   . Smokeless tobacco: Never Used  . Alcohol Use: No  . Drug Use: No  . Sexual Activity: Not on file   Other Topics Concern  . Not on file   Social History Narrative   Married (2nd), 3 children   Enjoys bridge and Chief Strategy Officer.    Regular exercise --fitness center 3 days per week. Considering starting in pool       Has living will   Requests DIL Earlyne Iba to be health care POA   Has DNR---done 08/14/12   No feeding tube if cognitively unaware         Review of Systems Appetite is still good Appetite is fine No cough No SOB    Objective:   Physical Exam  Constitutional: He appears well-developed. No distress.  Neck: Normal range of motion. Neck supple.  Cardiovascular: Normal rate and normal heart sounds.  Exam reveals no gallop.   No murmur heard. Sounds somewhat irregular  Pulmonary/Chest: Effort normal and breath sounds normal. No respiratory distress. He has no wheezes. He has no rales. He exhibits no tenderness.  Abdominal: Soft. Bowel sounds are normal. He exhibits no distension and no mass. There is no tenderness. There is no rebound and no guarding.  Musculoskeletal:  Thick ankles without pitting  Lymphadenopathy:    He has no cervical adenopathy.          Assessment & Plan:

## 2014-03-18 NOTE — Assessment & Plan Note (Addendum)
Very atypical Seems like distention/gas at times and mylanta may help But pain not related to eating and actually is eating fine (and weight stable).  Pain goes up into left chest and towards pelvis and seems mostly related to getting up from sitting or moving around No tenderness to suggest bony mets (ribs) Spleen doesn't seem enlarged but hard to tell  Will add prn mylanta Stop fish oil in case that could be causing gas Recommended he try the tramadol if the pain is persistent Will need to assess over time

## 2014-03-25 ENCOUNTER — Encounter: Payer: Self-pay | Admitting: Internal Medicine

## 2014-04-22 DIAGNOSIS — C7951 Secondary malignant neoplasm of bone: Secondary | ICD-10-CM

## 2014-04-22 DIAGNOSIS — R404 Transient alteration of awareness: Secondary | ICD-10-CM

## 2014-04-22 DIAGNOSIS — C7952 Secondary malignant neoplasm of bone marrow: Secondary | ICD-10-CM

## 2014-04-22 DIAGNOSIS — C61 Malignant neoplasm of prostate: Secondary | ICD-10-CM

## 2014-04-26 DIAGNOSIS — C61 Malignant neoplasm of prostate: Secondary | ICD-10-CM

## 2014-04-26 DIAGNOSIS — I442 Atrioventricular block, complete: Secondary | ICD-10-CM

## 2014-04-26 DIAGNOSIS — C7952 Secondary malignant neoplasm of bone marrow: Secondary | ICD-10-CM

## 2014-04-26 DIAGNOSIS — C7951 Secondary malignant neoplasm of bone: Secondary | ICD-10-CM

## 2014-04-26 DIAGNOSIS — G893 Neoplasm related pain (acute) (chronic): Secondary | ICD-10-CM

## 2014-04-29 DIAGNOSIS — F068 Other specified mental disorders due to known physiological condition: Secondary | ICD-10-CM

## 2014-04-29 DIAGNOSIS — C61 Malignant neoplasm of prostate: Secondary | ICD-10-CM

## 2014-04-29 DIAGNOSIS — M109 Gout, unspecified: Secondary | ICD-10-CM

## 2014-04-29 DIAGNOSIS — I10 Essential (primary) hypertension: Secondary | ICD-10-CM

## 2014-04-29 DIAGNOSIS — C7952 Secondary malignant neoplasm of bone marrow: Secondary | ICD-10-CM

## 2014-04-29 DIAGNOSIS — C7951 Secondary malignant neoplasm of bone: Secondary | ICD-10-CM

## 2014-05-28 DIAGNOSIS — F068 Other specified mental disorders due to known physiological condition: Secondary | ICD-10-CM

## 2014-05-28 DIAGNOSIS — C7952 Secondary malignant neoplasm of bone marrow: Secondary | ICD-10-CM

## 2014-05-28 DIAGNOSIS — C61 Malignant neoplasm of prostate: Secondary | ICD-10-CM

## 2014-05-28 DIAGNOSIS — I10 Essential (primary) hypertension: Secondary | ICD-10-CM

## 2014-05-28 DIAGNOSIS — C7951 Secondary malignant neoplasm of bone: Secondary | ICD-10-CM

## 2014-05-28 DIAGNOSIS — M109 Gout, unspecified: Secondary | ICD-10-CM

## 2014-05-31 ENCOUNTER — Ambulatory Visit: Payer: BC Managed Care – PPO | Admitting: Podiatry

## 2014-06-30 DIAGNOSIS — C61 Malignant neoplasm of prostate: Secondary | ICD-10-CM

## 2014-06-30 DIAGNOSIS — M109 Gout, unspecified: Secondary | ICD-10-CM

## 2014-06-30 DIAGNOSIS — R609 Edema, unspecified: Secondary | ICD-10-CM

## 2014-06-30 DIAGNOSIS — I10 Essential (primary) hypertension: Secondary | ICD-10-CM

## 2014-06-30 DIAGNOSIS — N400 Enlarged prostate without lower urinary tract symptoms: Secondary | ICD-10-CM

## 2014-06-30 DIAGNOSIS — H40009 Preglaucoma, unspecified, unspecified eye: Secondary | ICD-10-CM

## 2014-06-30 DIAGNOSIS — F0150 Vascular dementia without behavioral disturbance: Secondary | ICD-10-CM

## 2014-07-01 DIAGNOSIS — B351 Tinea unguium: Secondary | ICD-10-CM

## 2014-07-22 DIAGNOSIS — C61 Malignant neoplasm of prostate: Secondary | ICD-10-CM

## 2014-07-22 DIAGNOSIS — G893 Neoplasm related pain (acute) (chronic): Secondary | ICD-10-CM

## 2014-07-22 DIAGNOSIS — C7951 Secondary malignant neoplasm of bone: Secondary | ICD-10-CM

## 2014-07-22 DIAGNOSIS — C7952 Secondary malignant neoplasm of bone marrow: Secondary | ICD-10-CM

## 2014-07-22 DIAGNOSIS — I442 Atrioventricular block, complete: Secondary | ICD-10-CM

## 2014-08-06 ENCOUNTER — Encounter: Payer: Self-pay | Admitting: *Deleted

## 2014-08-06 DIAGNOSIS — I10 Essential (primary) hypertension: Secondary | ICD-10-CM

## 2014-08-06 DIAGNOSIS — C61 Malignant neoplasm of prostate: Secondary | ICD-10-CM

## 2014-08-06 DIAGNOSIS — F068 Other specified mental disorders due to known physiological condition: Secondary | ICD-10-CM

## 2014-08-06 DIAGNOSIS — C7951 Secondary malignant neoplasm of bone: Secondary | ICD-10-CM

## 2014-08-06 DIAGNOSIS — M109 Gout, unspecified: Secondary | ICD-10-CM

## 2014-08-06 DIAGNOSIS — C7952 Secondary malignant neoplasm of bone marrow: Secondary | ICD-10-CM

## 2014-08-16 DIAGNOSIS — L02219 Cutaneous abscess of trunk, unspecified: Secondary | ICD-10-CM

## 2014-08-16 DIAGNOSIS — L03319 Cellulitis of trunk, unspecified: Secondary | ICD-10-CM

## 2014-09-17 ENCOUNTER — Encounter: Payer: Self-pay | Admitting: *Deleted

## 2014-09-23 DIAGNOSIS — I110 Hypertensive heart disease with heart failure: Secondary | ICD-10-CM | POA: Diagnosis not present

## 2014-09-23 DIAGNOSIS — C7951 Secondary malignant neoplasm of bone: Secondary | ICD-10-CM | POA: Diagnosis not present

## 2014-09-23 DIAGNOSIS — M109 Gout, unspecified: Secondary | ICD-10-CM

## 2014-09-23 DIAGNOSIS — C61 Malignant neoplasm of prostate: Secondary | ICD-10-CM | POA: Diagnosis not present

## 2014-09-23 DIAGNOSIS — F0150 Vascular dementia without behavioral disturbance: Secondary | ICD-10-CM | POA: Diagnosis not present

## 2014-11-20 ENCOUNTER — Inpatient Hospital Stay: Payer: Self-pay | Admitting: Internal Medicine

## 2014-11-20 LAB — URINALYSIS, COMPLETE
Bilirubin,UR: NEGATIVE
Glucose,UR: NEGATIVE mg/dL (ref 0–75)
Ketone: NEGATIVE
LEUKOCYTE ESTERASE: NEGATIVE
NITRITE: NEGATIVE
PH: 5 (ref 4.5–8.0)
Protein: 30
Specific Gravity: 1.005 (ref 1.003–1.030)
WBC UR: NONE SEEN /HPF (ref 0–5)

## 2014-11-20 LAB — COMPREHENSIVE METABOLIC PANEL
ALK PHOS: 971 U/L — AB
ALT: 17 U/L
ANION GAP: 8 (ref 7–16)
Albumin: 2.9 g/dL — ABNORMAL LOW (ref 3.4–5.0)
BUN: 26 mg/dL — AB (ref 7–18)
Bilirubin,Total: 0.5 mg/dL (ref 0.2–1.0)
Calcium, Total: 8.5 mg/dL (ref 8.5–10.1)
Chloride: 106 mmol/L (ref 98–107)
Co2: 26 mmol/L (ref 21–32)
Creatinine: 1.33 mg/dL — ABNORMAL HIGH (ref 0.60–1.30)
EGFR (African American): >60
EGFR (Non-African Amer.): 53 — ABNORMAL LOW
GLUCOSE: 113 mg/dL — AB (ref 65–99)
Osmolality: 285 (ref 275–301)
Potassium: 4 mmol/L (ref 3.5–5.1)
SGOT(AST): 32 U/L (ref 15–37)
SODIUM: 140 mmol/L (ref 136–145)
Total Protein: 6.3 g/dL — ABNORMAL LOW (ref 6.4–8.2)

## 2014-11-20 LAB — CBC
HCT: 34.2 % — ABNORMAL LOW (ref 40.0–52.0)
HGB: 11.1 g/dL — ABNORMAL LOW (ref 13.0–18.0)
MCH: 33 pg (ref 26.0–34.0)
MCHC: 32.3 g/dL (ref 32.0–36.0)
MCV: 102 fL — ABNORMAL HIGH (ref 80–100)
PLATELETS: 179 10*3/uL (ref 150–440)
RBC: 3.36 10*6/uL — ABNORMAL LOW (ref 4.40–5.90)
RDW: 14.5 % (ref 11.5–14.5)
WBC: 7.5 10*3/uL (ref 3.8–10.6)

## 2014-11-20 LAB — PROTIME-INR
INR: 1.1
PROTHROMBIN TIME: 14.1 s (ref 11.5–14.7)

## 2014-11-20 LAB — APTT: Activated PTT: 30.1 secs (ref 23.6–35.9)

## 2014-11-21 LAB — CBC WITH DIFFERENTIAL/PLATELET
BASOS ABS: 0 10*3/uL (ref 0.0–0.1)
BASOS PCT: 0.2 %
Basophil #: 0 10*3/uL (ref 0.0–0.1)
Basophil %: 0 %
EOS ABS: 0 10*3/uL (ref 0.0–0.7)
Eosinophil #: 0 10*3/uL (ref 0.0–0.7)
Eosinophil %: 0.2 %
Eosinophil %: 0 %
HCT: 37.2 % — AB (ref 40.0–52.0)
HCT: 31.7 % — ABNORMAL LOW (ref 40.0–52.0)
HGB: 12.1 g/dL — ABNORMAL LOW (ref 13.0–18.0)
HGB: 10.1 g/dL — AB (ref 13.0–18.0)
LYMPHS PCT: 9.1 %
Lymphocyte #: 1.1 10*3/uL (ref 1.0–3.6)
Lymphocyte #: 0.6 10*3/uL — ABNORMAL LOW (ref 1.0–3.6)
Lymphocyte %: 3.5 %
MCH: 32.6 pg (ref 26.0–34.0)
MCH: 32.3 pg (ref 26.0–34.0)
MCHC: 32.5 g/dL (ref 32.0–36.0)
MCHC: 32 g/dL (ref 32.0–36.0)
MCV: 100 fL (ref 80–100)
MCV: 101 fL — ABNORMAL HIGH (ref 80–100)
MONO ABS: 1.5 x10 3/mm — AB (ref 0.2–1.0)
MONOS PCT: 11.6 %
MONOS PCT: 9.2 %
Monocyte #: 1.4 x10 3/mm — ABNORMAL HIGH (ref 0.2–1.0)
NEUTROS PCT: 78.9 %
Neutrophil #: 9.5 10*3/uL — ABNORMAL HIGH (ref 1.4–6.5)
Neutrophil #: 13.7 10*3/uL — ABNORMAL HIGH (ref 1.4–6.5)
Neutrophil %: 87.3 %
Platelet: 179 10*3/uL (ref 150–440)
Platelet: 147 10*3/uL — ABNORMAL LOW (ref 150–440)
RBC: 3.7 10*6/uL — ABNORMAL LOW (ref 4.40–5.90)
RBC: 3.14 10*6/uL — AB (ref 4.40–5.90)
RDW: 14.8 % — AB (ref 11.5–14.5)
RDW: 14.7 % — ABNORMAL HIGH (ref 11.5–14.5)
WBC: 12.1 10*3/uL — AB (ref 3.8–10.6)
WBC: 15.7 10*3/uL — AB (ref 3.8–10.6)

## 2014-11-21 LAB — BASIC METABOLIC PANEL
Anion Gap: 9 (ref 7–16)
BUN: 22 mg/dL — AB (ref 7–18)
Calcium, Total: 9.2 mg/dL (ref 8.5–10.1)
Chloride: 102 mmol/L (ref 98–107)
Co2: 26 mmol/L (ref 21–32)
Creatinine: 1.18 mg/dL (ref 0.60–1.30)
EGFR (African American): >60
Glucose: 135 mg/dL — ABNORMAL HIGH (ref 65–99)
Osmolality: 279 (ref 275–301)
POTASSIUM: 3.9 mmol/L (ref 3.5–5.1)
Sodium: 137 mmol/L (ref 136–145)

## 2014-11-21 LAB — PROTIME-INR
INR: 1.1
Prothrombin Time: 14 secs (ref 11.5–14.7)

## 2014-11-22 LAB — CBC WITH DIFFERENTIAL/PLATELET
BASOS PCT: 0.1 %
Basophil #: 0 10*3/uL (ref 0.0–0.1)
Eosinophil #: 0 10*3/uL (ref 0.0–0.7)
Eosinophil %: 0 %
HCT: 28.6 % — ABNORMAL LOW (ref 40.0–52.0)
HGB: 9.3 g/dL — ABNORMAL LOW (ref 13.0–18.0)
LYMPHS ABS: 0.7 10*3/uL — AB (ref 1.0–3.6)
LYMPHS PCT: 5.4 %
MCH: 32.6 pg (ref 26.0–34.0)
MCHC: 32.7 g/dL (ref 32.0–36.0)
MCV: 100 fL (ref 80–100)
Monocyte #: 1.6 x10 3/mm — ABNORMAL HIGH (ref 0.2–1.0)
Monocyte %: 12.9 %
Neutrophil #: 10.1 10*3/uL — ABNORMAL HIGH (ref 1.4–6.5)
Neutrophil %: 81.6 %
PLATELETS: 151 10*3/uL (ref 150–440)
RBC: 2.87 10*6/uL — ABNORMAL LOW (ref 4.40–5.90)
RDW: 14.3 % (ref 11.5–14.5)
WBC: 12.3 10*3/uL — ABNORMAL HIGH (ref 3.8–10.6)

## 2014-11-22 LAB — BASIC METABOLIC PANEL
Anion Gap: 8 (ref 7–16)
BUN: 33 mg/dL — ABNORMAL HIGH (ref 7–18)
CALCIUM: 8.1 mg/dL — AB (ref 8.5–10.1)
CREATININE: 1.39 mg/dL — AB (ref 0.60–1.30)
Chloride: 107 mmol/L (ref 98–107)
Co2: 26 mmol/L (ref 21–32)
EGFR (African American): >60
EGFR (Non-African Amer.): 50 — ABNORMAL LOW
Glucose: 170 mg/dL — ABNORMAL HIGH (ref 65–99)
Osmolality: 292 (ref 275–301)
Potassium: 4.2 mmol/L (ref 3.5–5.1)
Sodium: 141 mmol/L (ref 136–145)

## 2014-11-23 LAB — HEMOGLOBIN: HGB: 7.9 g/dL — ABNORMAL LOW (ref 13.0–18.0)

## 2014-11-24 ENCOUNTER — Encounter: Payer: Self-pay | Admitting: Family Medicine

## 2014-11-24 ENCOUNTER — Other Ambulatory Visit: Payer: Self-pay | Admitting: Family Medicine

## 2014-11-24 ENCOUNTER — Telehealth: Payer: Self-pay | Admitting: Family Medicine

## 2014-11-24 LAB — HEMOGLOBIN: HGB: 8.4 g/dL — AB (ref 13.0–18.0)

## 2014-11-24 NOTE — Telephone Encounter (Signed)
Received call from [facility] Pt resides. He just got d/c'd from [facility] s/p ORIF of hip fracture today. Has been agitated and delusional upon arrival and since that time has continued to be the same. Has not voided yet.  Nurses plan to cath pt.  They request Haldol 5mg  SQ prn order and I authorized this. The only pain med they have on hand is tramadol so they will give him this.

## 2014-11-25 ENCOUNTER — Telehealth: Payer: Self-pay

## 2014-11-25 DIAGNOSIS — S72001A Fracture of unspecified part of neck of right femur, initial encounter for closed fracture: Secondary | ICD-10-CM

## 2014-11-25 DIAGNOSIS — R339 Retention of urine, unspecified: Secondary | ICD-10-CM

## 2014-11-25 DIAGNOSIS — R410 Disorientation, unspecified: Secondary | ICD-10-CM

## 2014-11-25 NOTE — Telephone Encounter (Signed)
Seen today and orders done Spoke with daughter

## 2014-11-25 NOTE — Telephone Encounter (Signed)
PLEASE NOTE: All timestamps contained within this report are represented as [place] Standard Time. CONFIDENTIALTY NOTICE: This fax transmission is intended only for the addressee. It contains information that is legally privileged, confidential or otherwise protected from use or disclosure. If you are not the intended recipient, you are strictly prohibited from reviewing, disclosing, copying using or disseminating any of this information or taking any action in reliance on or regarding this information. If you have received this fax in error, please notify us immediately by telephone so that we can arrange for its return to [place]. Phone: [phone], Toll-Free: [phone], Fax: [phone] Page: 1 of 1 Call Id: [id number] [facility] [place] Patient Name: Aaron Cervantes Gender: Male DOB: 11/26/1917 Age: 80 Y Return Phone Number: Address: [address] City/State/Zip: [place] [address] Client [place] [facility] - Client Client Site [facility] Physician Viviana Simpler Contact Type Call Call Type Page Only Caller Name Colette Ribas, [facility] Relationship To Patient Provider Is this call to report lab results? No Return Phone Number Unavailable Initial Comment (2nd attempt, Lita Mains, 1st attempt, [facility]) [place] states the patient fell and broke his hip and now he is psychotic after taking ultran, morphine, and hydol. States he is delirious. Phones are out at the facility. CBN: [phone]. Nurse Assessment Guidelines Guideline Title Affirmed Question Affirmed Notes Nurse Date/Time (Eastern Time) Disp. Time Eilene Ghazi Time) Disposition Final User 11/24/2014 10:38:31 PM Send to [place], [place] 11/24/2014 10:51:52 PM Paged On Call back to Call [place], [place] 11/24/2014 10:57:06 PM Paged On Call back  to Call [place], [place] 11/24/2014 11:21:50 PM Page Completed Velta Addison Clydene Laming, Amy Comments User: [place] Lions Date/Time Eilene Ghazi Time): 11/24/2014 10:50:36 PM Phoned on call. [place] and [place] not be on call. User: [place] Lions Date/Time Eilene Ghazi Time): 11/24/2014 10:56:38 PM Phoned on call secondary. Dr said he is not on call, he said he is going to call back with the correct on call physicians. User: [place] Lions Date/Time Eilene Ghazi Time): 11/24/2014 11:21:43 PM Secondary on call returned call. Dr took the call. Information provided. Dr. transferred to caller.

## 2014-12-20 DIAGNOSIS — F0390 Unspecified dementia without behavioral disturbance: Secondary | ICD-10-CM

## 2014-12-20 DIAGNOSIS — C7952 Secondary malignant neoplasm of bone marrow: Secondary | ICD-10-CM

## 2014-12-20 DIAGNOSIS — Z9181 History of falling: Secondary | ICD-10-CM

## 2014-12-20 DIAGNOSIS — C61 Malignant neoplasm of prostate: Secondary | ICD-10-CM

## 2014-12-24 DEATH — deceased

## 2015-04-16 NOTE — Discharge Summary (Signed)
Dates of Admission and Diagnosis:  Date of Admission 20-Nov-2014   Date of Discharge 24-Nov-2014   Admitting Diagnosis Hip fracture   Final Diagnosis Fall , right hip fracture- s/p surgery Acute blood loss anemia GOUT Constipation Beign prostatic hypertrophy- urinary retention.    Chief Complaint/History of Present Illness a 81 year old pleasant Caucasian male residing at [facility] who was walking in the hall and suddenly tripped over and fell on his right side. The patient was complaining of right hip pain and he was brought into the ED. In the ED, the patient was diagnosed with a right hip subcapital fracture. On-call orthopedics doctor, Dr. Jefm Bryant, was notified by the ER physician and hospitalist team is called to admit the patient. During my examination, the patient is resting comfortably. No family members at bedside. Pain is well controlled. Asking when he will get surgery done. Denies any chest pain or shortness of breath. He reports that he had a pacemaker placed in the past but could not recall the cardiologist???s name. No other complaints.   Allergies:  Sulfa drugs: Unknown  Routine Hem:  28-Nov-15 14:16   Hemoglobin (CBC)  11.1  29-Nov-15 04:47   Hemoglobin (CBC)  12.1    14:26   Hemoglobin (CBC)  10.1  30-Nov-15 03:53   Hemoglobin (CBC)  9.3  01-Dec-15 05:44   Hemoglobin (CBC)  7.9 (Result(s) reported on 23 Nov 2014 at 06:12AM.)  02-Dec-15 05:28   Hemoglobin (CBC)  8.4 (Result(s) reported on 24 Nov 2014 at 05:59AM.)   Pertinent Past History:  Pertinent Past History Prostate cancer, gout, benign prostatic hypertrophy, insomnia, osteoarthritis, left-sided blind eye, hypertension.   Hospital Course:  Hospital Course 81 yo male w/ hx of Prostate cancer, Gout, BPH who came to the hospital w/ a fall and noted to have right hip fracture.   1. s/p fall and right hip fracture - seen by Ortho and underwent ORIF 11/21/14.  - cont. pain control and care as per Ortho.   -  PT consult. Due to dementia- may not be a very good candidate. - cont. Lovenox for DVT prophylaxis.   2. GOUt - no acute attack. - cont. allopurinol  3. hx of BPH/Prostate cancer - s/p foley.  - cont. Flomax.  - after removing foley had one incontinence but then did not urinate in  last 18 hrs- had >400 on bladder scan- needed in-out cath twice. - Need monitoring of urine output- if does not have urination in 12 hrs- need I/o cath at rehab. - Due to Hx of bladdeer cancer- will need to follow up with urologist in office.  4. GERD - cont. Zantac  5. Acute Blood loss Anemia - likely due to blood loss from surgery.  - follow Hgb.  7.9- got 1 unit PRBC as per ortho. now Stable.  6. Dementia - high risk for sundowning.  - avoid Benzo's.  USE PRN haldol for agitation.   Condition on Discharge Stable   DISCHARGE INSTRUCTIONS HOME MEDS:  Medication Reconciliation: Patient's Home Medications at Discharge:     Medication Instructions  allopurinol 300 mg oral tablet  1 tab(s) orally once a day (in the morning)   melatonin 3 mg oral tablet  1 tab(s) orally once (at bedtime)   bisacodyl 5 mg oral delayed release tablet  1 tab(s) orally once a day, As Needed - for Constipation   almacone 200 mg-200 mg-20 mg/5 ml oral suspension  15 milliliter(s) orally every 2 hours, As Needed - for Constipation  artificial tears preserved ophthalmic solution   to each affected eye , As Needed   flomax 0.4 mg oral capsule  1 cap(s) orally 2 times a day   systane - ophthalmic gel  1 drop(s) to each affected eye 4 times a day LEFT EYE   ultram 50 mg oral tablet  1 tab(s) orally 3 times a day AS NEEDED   tylenol 325 mg oral tablet  2 tab(s) orally 3 times a day   milk of magnesia  30 milliliter(s) orally 2 times a day, As Needed   erythromycin ophthalmic 0.5% ophthalmic ointment  1 application to each affected eye 2 times a day LEFT EYE   sodium chloride, hypertonic, ophthalmic  1 drop(s) to each affected  eye once a day (at bedtime) RIGHT EYE   miralax oral powder for reconstitution  17 gram(s) orally every other day (at bedtime)   enoxaparin  40 milligram(s) subcutaneous once a day x 14 days   ranitidine 150 mg oral tablet  1 tab(s) orally every 12 hours     Physician's Instructions:  Treatments have some urinary retention issues, if does not urinate for 8 hours, need in and out catheter- may not need it for long.   Diet Regular   Dietary Supplements Ensure   Dietary Supplements Frequency Three times per day   Diet Special Instructions strict aspiration precautions, see discharge instructions. need to reevaluate in 3-4 days by speech therapist to upgrade diet.   Activity Limitations As tolerated   Return to Work Not Applicable   Time frame for Follow Up Appointment 1-2 weeks  ortho clinic.   Other Comments Speech therapy evaluation in next 3-4 days to upgrade diet.     Skip Estimable P(Consultant): [facility], [address], [place], [place] [address], [place]   Silvio Pate, Richard(Family Physician): [facility], [place], [address], [place], [place] [address], [place] [phone]  Electronic Signatures: Vaughan Basta (MD)  (Signed 02-Dec-15 15:08)  Authored: ADMISSION DATE AND DIAGNOSIS, CHIEF COMPLAINT/HPI, Allergies, PERTINENT LABS, PERTINENT PAST HISTORY, HOSPITAL COURSE, [place], PATIENT INSTRUCTIONS, Follow Up Physician   Last Updated: 02-Dec-15 15:08 by Vaughan Basta (MD)

## 2015-04-16 NOTE — Consult Note (Signed)
Aaron Cervantes is a 81 year old male who slipped and fell today at his place of residence, [place]. He denies any dizziness or presyncope, denies head trauma or LOC. He complains of isolated right hip pain that is mild at rest but severe with activity. he denies any antecedent hip pain. He ambulates with a walker at baseline for balance issues, he denies prior falls or fractures. He is a DNR which he agrees to suspend for surgery. has a daughter in [place] who knows he is here. The patient has asked that I do not contact her myself at this time. When asked why he stated he did not want to bother her, and despite my multiple requests to touch base with his family he has asked that I do not. I find him competent to make this decision, and I will ask him again in the morning before surgery.  Prostate cancer, HTN, goutstatus: DNR ExamSigns: Temperature 97.8 Pulse 77 Blood pressure 179/90 Pulse Oximeter 90% on 2L O48flat in ED stretcherAAOx3 (knows his name, his location, and the date, as well as why he is in the hospital)[place]/ATSymmetric chest riseHeart rate regularSoft, NT, NDexaminationdeformity, bruising or tenderness in upper extremities bilaterally, he is grossly neurovascularly intactdeformity, bruising or tenderness in left lower extremity, he is grossly neurovascularly intactbruising/abrasionswith log rollintact EHL/FHL/TA/GSintact to light touch in DP/SP/T nerve distributionsrefill brisk 7.511.1/[id number] - displaced subcapital femoral neck fracture, 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 stable from prior imaging- baseline cardiomegalypending 89 M with right femoral neck fractureis for right hip hemiarthroplasty tomorrow morning, he is medically cleared, consent is in the chart, and he will be NPO at [place] is aware and he is tentatively posted for the OR in the morningcontrol - I recommend standing tylenol and morphine 2-4 mg IV as neededregimentcatheter has been placeduntil surgeryhave ordered  a femur xray to evaluate where the prosthesis will end for the presence of any metastatic diseaseis comfortable in bed when lying still, will defer placing tractionmy conversation with the patient, his DNR is suspended for surgery and the immediate postoperative periodwill ask him once more if I can contact his daughter, who is his POA, but otherwise respect his wishes not to contact her as he remains competent to make that decision    Electronic Signatures: Peterson Lombard (MD)  (Signed on 28-Nov-15 18:38)  Authored  Last Updated: 28-Nov-15 18:38 by Peterson Lombard (MD)

## 2015-04-16 NOTE — Consult Note (Signed)
Postop check  Patient remains delerius. He denies any hip pain, chest pain, or shortness of breath. He does not recall having surgery today. He had an episode of low blood pressure in the PACU which has improved on fluids  VS T 98.1 P 87 BP 103/66 P 90 O2 90%on 2L O2 Lying in bed comfortably with hip abduction pillow in place RLE - dressing clean dry and intact Motor intact EHL/FHL/TA/GS Sensation intact to light touch DP/SP/T/S/S Brisk capillary refill  CBC WBC 15.7 Hb 10.1 Hct 31.7 Plt 147  Assessment: 89 M doing well s/p Right hip hemiarthroplasty -Monitor blood pressure closesly, volume resuscitation as needed -Trend CBC  -Pain control - limit narcotics as much as possible -DVT ppx may resume -Hip abduction precautions -PT - WBAT -IV ancef x 24 hrs -Incentive spirometry -SCDs   Electronic Signatures: Peterson Lombard (MD)  (Signed on 29-Nov-15 15:57)  Authored  Last Updated: 29-Nov-15 15:57 by Peterson Lombard (MD)

## 2015-04-16 NOTE — Op Note (Signed)
PATIENT NAME:  Aaron Cervantes, Aaron Cervantes MR#:  [id number] DATE OF BIRTH:  [birth year]  DATE OF PROCEDURE:  11/21/2014  DIAGNOSIS: Right hip femoral neck fracture.  SURGERY: Right hip hemiarthroplasty.  IMPLANTS: [place]uy [place] hemiarthroplasty size 4 stem, size 51 head, +0 neck.  ESTIMATED BLOOD LOSS: 450 mL.  INTRAVENOUS FLUIDS: 1500 mL.  COMPLICATIONS: None.  DISPOSITION: Stable to the PACU.  INDICATIONS: The patient is a 81 year old male who was brought into [facility] Emergency Room on 11/20/2014 after a slip and fall at his place of residence of [place]. He was able to relate that he slipped and fell on a wet floor falling on his right side, complaining of right hip pain. X-rays were obtained in the Emergency Room showing a subcapital femoral neck fracture of the right hip. Orthopedics was consulted. The patient was indicated for a right hip hemiarthroplasty for both pain relief and mobilization and to prevent the complications related to prolonged bedrest. The patient walks with a walker at baseline and after a long discussion with the patient and his family, informed consent was obtained.  DESCRIPTION OF PROCEDURE: On the day of surgery, the patient was met in the preoperative holding area. All preoperative clearances were reviewed. Anesthesia visited with the patient. The nursing staff reviewed the consent, which was verified. The right hip was marked and the patient was taken to the operating room.  The patient was brought into the operating room and initial timeout done to ensure the identity of the patient. After that, anesthesia in the form of spinal anesthesia was administered along with 2 grams of IV Ancef. The patient was then positioned in the left lateral decubitus position with his right hip facing up on a pegboard. All bony prominences were well padded. Preoperative examination showed no skin lesions at the site of surgery, however, a small abrasion over his right knee as well as  bleeding from the tip of his penis at the entrance of a Foley catheter. The leg was cleaned with alcohol and then prepped and draped in a standard sterile fashion. A timeout was performed and a right posterolateral approach to the right hip was done using an 8 cm incision over the posterior aspect of the greater tuberosity using a knife to incise through skin and subcutaneous fat. An elevator was used to elevate the fat off the fascia. A deep knife was used to create a poke hole through the fascia and Metzenbaum scissors were used to incise the fascia distally and proximally. The Charnley retractors were placed with care taken to avoid the sciatic nerve. The bursa was elevated using electrocautery. At this point, a sharp Hohmann was placed over the superior aspect of the fractured femoral neck to retract the gluteus meatus, thus revealing the piriformis tendon which was tagged with an Ethibond suture and then released with electrocautery along with the short external rotators and the quadratus femoris. Cautery of the quadratus femoris resulted in a small amount of bleeding. As the middle femoral circumflex was cauterized, the joint capsule was revealed with its disruption secondary to the fracture. Several small bony fragments were attached to the capsular avulsions. These fragments were excised. The capsulotomy was completed in the shape of a T revealing the fractured femoral neck and the head of the femur sitting in the acetabulum. Using a femoral neck cutting guide, the lesser trochanter was used to reference 1 cm femoral neck cut. The set of this cut was marked, a saw was used to excise about 0.4 cm  of bone. Then, a corkscrew was used to remove the femoral head. The femoral neck was then exposed using a retractor and a box osteotome was used to reveal the canal. Canal finder was used to identify the path of the femoral canal and at this point a lateral reamer was gently entered into the canal to lateralize the  approach for the prosthesis. A size 1 broach was passed with ease into the canal, then removed with rasping along the way. A size 2 broach was then passed with slight difficulty, which was used as a rasp on the way out. Then a size 3 broach was passed into the stem with caution with frequent pauses to prevent fracture. Once seated completely, it was felt to be well secured and a trial head prosthesis was placed with a zero neck and a 51 head. The hip was reduced. The length was deemed nearly identical by assessing the tibial tubercles. The range of motion in the position of sleep and with hip and knee flexion and external rotation was deemed stable. The hip was then dislocated, and it was noticed that the stem had loosened slightly, so a size 4 stem was then passed with care and patience, and once completely seated that was the selected stem size. The femoral canal was prepared using a bacitracin solution and irrigation, was then dried and cement was mixed. A cement centralizer was trialed and placed and a size 2 cement stopper was placed in the canal with gentle mallet taps. Once the cement was mixed using a cement gun, it was placed and then pressurized, and the size 4 stem was placed with care ensuring anteversion and lateralization to prevent varus. For 15 minutes, the stem was held in place until the cement cured. Once cured, a size 51 head a zero neck were trialed, length and stability were deemed adequate and a 51 femoral head was placed with a standard neck. Once the hip was reduced, it was once again assessed with stability and length deemed adequate. The hip was irrigated copiously. Three drill holes were placed in the greater trochanter. Sutures were passed and a capsular  repair and piriformis musculotendinous capsular repair was completed. The fascia was closed with 0 Vicryl. The subcuticular stitch was done with 2-0 Vicryl. The skin was closed with staples. A sterile compressive dressing was placed and  a hip abduction pillow was placed. The patient was positioned in the supine position. The sponge and needle counts were correct at the end of the case. The patient was transferred to the PACU in stable condition. One x-ray was obtained showing the prosthesis in good position with near equivalent leg lengths.   ____________________________ Peterson Lombard, MD mdk:TT D: 11/21/2014 16:13:43 ET T: 11/21/2014 22:21:44 ET JOB#: [id number]  cc: Peterson Lombard, MD, <Dictator>

## 2015-04-16 NOTE — Consult Note (Signed)
Chief Complaint:  Subjective/Chief Complaint [name] is confused this morning. Nursing reports he has pulled on his foley catheter and IV lines overnight, and has become disoriented. He denies pain, and is able to converse with me, but unlike when we met last night, he is currently unaware of where he is and why he is here.   VITAL SIGNS/ANCILLARY NOTES: **Vital Signs.:   29-Nov-15 06:16  Vital Signs Type Routine  Temperature Temperature (F) 98.4  Celsius 36.8  Temperature Source oral  Pulse Pulse 91  Respirations Respirations 18  Systolic BP Systolic BP 376  Diastolic BP (mmHg) Diastolic BP (mmHg) 76  Mean BP 98  Pulse Ox % Pulse Ox % 92  Pulse Ox Activity Level  At rest  Oxygen Delivery 2L  *Intake and Output.:   Daily 29-Nov-15 07:00  Grand Totals Intake:   Output:  1400    Net:  -1400 24 Hr.:  -1400  Urine ml     Out:  1400  Length of Stay Totals Intake:   Output:  1400    Net:  -1400   Brief Assessment:  GEN no acute distress   Respiratory normal resp effort   Additional Physical Exam AAOx1 RLE - short and externally rotated unable to SLR motor intact to EHL/FHL/TA/GS Sensation intact to DP/SP/T capillary refill brisk   Radiology Results: XRay:    28-Nov-15 14:41, Chest 1 View AP or PA  Chest 1 View AP or PA   REASON FOR EXAM:    preoperative  COMMENTS:       PROCEDURE: DXR - DXR CHEST 1 VIEWAP OR PA  - Nov 20 2014  2:41PM     CLINICAL DATA:  Right hip fracture.  Preop evaluation.  Fell.    EXAM:  CHEST - 1 VIEW    COMPARISON:  12/09/2012.    FINDINGS:  Stable elevation of the right hemidiaphragm. Left subclavian  pacemaker leads are unchanged. The cardiac silhouette remains  enlarged. Bilateral calcified granulomata are unchanged. Diffuse  osteopenia.     IMPRESSION:  No acute abnormality. Stable cardiomegaly and elevation of the right  hemidiaphragm.      Electronically Signed    By: Enrique Sack M.D.    On: 11/20/2014 15:15          Verified By: Gerald Stabs, M.D.,    [phone] 14:41, Hip Right Complete  Hip Right Complete   REASON FOR EXAM:    fall injury  COMMENTS:       PROCEDURE: DXR - DXR HIP RIGHT COMPLETE  - Nov 20 2014  2:41PM     CLINICAL DATA:  Fall, right leg pain    EXAM:  RIGHT HIP - COMPLETE 2+ VIEW    COMPARISON:  None.    FINDINGS:  Subcapital right hip fracture. Foreshortening with varus angulation.  Lytic metastases involving the lateral right acetabulum and right  parasymphyseal region.    Heterogeneous sclerosis involving the visualized osseous structures,  particularly involving the right inferior pubic ramus.    While the right hip fracture is likely post-traumatic, underlying  pathologic fracture is not excluded.     IMPRESSION:  Subcapital right hip fracture, as described above.    While likely post-traumatic, underlying pathologic fracture is not  excluded given the associated findings.  Electronically Signed    By: Julian Hy M.D.    On: 11/20/2014 15:24         Verified By: Julian Hy, M.D.,    28-Nov-15 19:15,  Femur Right  Femur Right   REASON FOR EXAM:    preoperative planning  COMMENTS:   Bedside (portable):Y    PROCEDURE: DXR - DXR FEMUR RIGHT  - Nov 20 2014  7:15PM     CLINICAL DATA:  Moderately displaced right hip fracture.    EXAM:  RIGHT FEMUR - 2 VIEW    COMPARISON:  Same day.    FINDINGS:  Moderately displaced fracture is seen involving the proximal right  femoral neck with foreshortening. No other fracture or dislocation  is seen involving the remaining portion of the femur. As noted on  prior exam, lucencies are seen involving the right acetabulum and  other portions of the visualized pelvis concerning for metastatic  disease. This fracture appears to be closed. It may simply be  posttraumatic, but pathologic fracture due to metastatic disease  cannot be excluded.    IMPRESSION:  Moderately displaced fracture  involving the proximal right femoral  neck with foreshortening. Metastatic disease in the pelvis is again  noted.      Electronically Signed    By: Sabino Dick M.D.    On: 11/20/2014 21:20         Verified By: Marveen Reeks, M.D.,   Assessment/Plan:  Invasive Device Daily Assessment of Necessity:  Does the patient currently have any of the following indwelling devices? foley   Indwelling Urinary Catheter continued, requirement due to   Reason to continue Indwelling Urinary Catheter Immobilization due to pelvic/hip fracture or ortho procedure necessitating immobilization   Assessment/Plan:  Assessment 89 M with right hip fracture for the OR today, with acute delerium   Plan I had a long discussion with his daughter, POA, on the phone, and after discussing risks, benefits and alternatives of surgery informed consent was obtained.  NPO/IVF Pain control - limit narcotics if possible for OR this morning, please hold DVT chemoprophylaxis   Electronic Signatures: Peterson Lombard (MD)  (Signed [phone] 07:07)  Authored: Chief Complaint, VITAL SIGNS/ANCILLARY NOTES, Brief Assessment, Radiology Results, Assessment/Plan   Last Updated: 29-Nov-15 07:07 by Peterson Lombard (MD)

## 2015-04-16 NOTE — H&P (Signed)
PATIENT NAME:  Aaron Cervantes, [name] MR#:  [id number] DATE OF BIRTH:  [birth year]  DATE OF ADMISSION:  11/20/2014  PRIMARY CARE PHYSICIAN: Venia Carbon, MD  REFERRING EMERGENCY ROOM PHYSICIAN: Latina Craver, MD  CHIEF COMPLAINT: Fall.   HISTORY OF PRESENT ILLNESS: The patient is a 81 year old pleasant Caucasian male residing at [facility] who was walking in the hall and suddenly tripped over and fell on his right side. The patient was complaining of right hip pain and he was brought into the ED. In the ED, the patient was diagnosed with a right hip subcapital fracture. On-call orthopedics doctor, Dr. Jefm Bryant, was notified by the ER physician and hospitalist team is called to admit the patient. During my examination, the patient is resting comfortably. No family members at bedside. Pain is well controlled. Asking when he will get surgery done. Denies any chest pain or shortness of breath. He reports that he had a pacemaker placed in the past but could not recall the cardiologist's name. No other complaints.   PAST MEDICAL HISTORY: Prostate cancer, gout, benign prostatic hypertrophy, insomnia, osteoarthritis, left-sided blind eye, hypertension.   PAST SURGICAL HISTORY: Pacemaker placement, cataract surgery.   ALLERGIES: SULFA DRUGS.    PSYCHOSOCIAL HISTORY: Residing at [facility]. Denies any history of smoking, alcohol, or illicit drug usage.   FAMILY HISTORY: Gout.   HOME MEDICATIONS: Tylenol Arthritis 650 mg 1 capsule p.o. 3 times a day, tramadol 50 mg 0.5 to 1 tablet p.o. 3 times a day as needed for pain, Systane ophthalmic solution 1 drop into each eye once a day in the a.m., simvastatin 20 mg p.o. once daily, MiraLax 17 grams p.o. with 8 ounces of water daily in a.m., melatonin 3 mg p.o. at bedtime, Lumigan eye drops 1 drop to left eye once a day in a.m., losartan 50 mg p.o. once daily, Flomax 0.4 mg 1 capsule p.o. once a day, Dulcolax 5 mg 1 tablet p.o. once daily as needed for  constipation, bicalutamide 50 mg p.o. once a day, aspirin 81 mg once daily, allopurinol 300 mg p.o. once daily.   REVIEW OF SYSTEMS:  CONSTITUTIONAL: Denies any fever or fatigue.  EYES: Denies blurry vision, but he has left blind eye and also he has cataracts. Denies any glaucoma.  ENT: Denies epistaxis or discharge.  RESPIRATORY: Denies cough or COPD.  CARDIOVASCULAR: No chest pain, palpitations. He has a pacemaker.  GASTROINTESTINAL: Denies nausea, vomiting, diarrhea. No hematemesis or melena.  GENITOURINARY: No dysuria or hematuria.  He has a history of prostate cancer. HEMATOLOGIC AND LYMPHATIC: No anemia, easy bruising, or bleeding.  INTEGUMENTARY: No acne, rash, lesions.  MUSCULOSKELETAL: Complaining of right hip pain from fall. Also has a chronic history of gout.  NEUROLOGIC: Denies any vertigo, ataxia.  PSYCHIATRIC: Denies any psychiatric problems.   PHYSICAL EXAMINATION:  VITAL SIGNS: Temperature 97.6, pulse 65 to 70, respirations 18, blood pressure is 134/64, pulse oximetry 93% on room air.  GENERAL APPEARANCE: Not in acute distress, resting comfortably. He wants to watch TV. Moderately built and nourished.  HEENT: Normocephalic, atraumatic. Pupils are equal, reactive to light and accommodation. No scleral icterus. No conjunctival injection. No sinus tenderness. Moist mucous membranes.  NECK: Supple. No JVD. No thyromegaly. Range of motion is intact.  LUNGS: Clear to auscultation bilaterally. No accessory muscle use and no anterior chest wall tenderness on palpation.  CARDIAC: S1, S2 normal. Regular rate and rhythm. Pacemaker site is intact. Positive ejection systolic murmur.  GASTROINTESTINAL: Soft. Bowel sounds are positive in  all 4 quadrants. Nontender, nondistended. No hepatosplenomegaly. No masses felt.  NEUROLOGIC: Awake, alert, and oriented x 2 to 3. Intermittent episodes of pleasant confusion. Reflexes are 2+.  EXTREMITIES: Right hip area is externally rotated, tender.   SKIN: Warm to touch. Normal turgor. No rashes. No lesions.  PSYCHIATRIC: Fearful mood and affect.   LABORATORY AND IMAGING STUDIES: Glucose 113, BUN 26, creatinine 1.33, sodium 140, potassium 4.0, chloride 106, CO2 is 26, GFR greater than 60. Serum osmolality and calcium are normal. LFTs: Total protein 6.3, albumin 2.9, bilirubin total normal, alkaline phosphatase 971, AST and ALT are normal. WBC 7.5, hemoglobin 11.1, hematocrit 34.2, platelets are 179,000. PT, INR, and activated PTT are normal. Twelve-lead EKG: Paced rhythm.  Right hip x-ray: Complete subcapital right hip fracture, likely posttraumatic. Underlying pathological fracture is not excluded given the associated findings. Chest x-ray, one view: No acute abnormality, stable cardiomegaly, and elevation of the right hemidiaphragm.   ASSESSMENT AND PLAN: A 81 year old pleasant Caucasian male who was brought into the Emergency Department from [facility] after he sustained a fall. Diagnosed with a right hip subcapital fracture. On-call orthopedics doctor, Dr. Humphrey Rolls, is notified. Hospitalist team is called to admit the patient.  1.  Acute right hip pain from right hip subcapital fracture: The patient is medically cleared for surgery. Orthopedics consult is placed and notified to Dr. Humphrey Rolls by the Emergency Room  physician, Dr. Corky Downs. Pain management with Percocet and morphine. We will provide bowel regimen.  2.  History of hypertension: Blood pressure is stable at this time. We will continue losartan.  3.  History of pacemaker placement. The patient is on a daily aspirin. We will hold as we are anticipating right hip surgery.  4.  Left-sided blind eye and cataract: We will continue his ophthalmic drops including Systane and Lumigan.  5.  Insomnia: I will continue his home medication melatonin at bedtime.  6.  Gout: We will continue allopurinol with close monitoring of the renal function.  7.  History of prostate cancer: The patient is on by  bicalutamide. We will continue that.  8.  We will provide gastrointestinal prophylaxis with Pepcid.  9.  Deep vein thrombosis prophylaxis with sequential compression devices.  10.  Plan of care was discussed in detail with the patient. He agreed with the plan. He is DO NOT RESUSCITATE. His daughter is medical power of attorney. Plan of care was discussed with the patient. He is aware of the plan.   TOTAL TIME SPENT: 45 minutes.   ____________________________ Nicholes Mango, MD ag:ts D: 11/20/2014 17:42:04 ET T: 11/20/2014 18:38:15 ET JOB#: [id number]  cc: Nicholes Mango, MD, <Dictator> Venia Carbon, MD Kathrene Alu., MD   Nicholes Mango MD ELECTRONICALLY SIGNED 12/09/2014 16:56

## 2015-04-18 LAB — SURGICAL PATHOLOGY

## 2015-04-20 NOTE — Op Note (Signed)
PATIENT NAME:  Aaron Cervantes, Aaron Cervantes MR#:  [id number] DATE OF BIRTH:  [birth year]  DATE OF PROCEDURE:  11/21/2014  DIAGNOSIS: Right hip femoral neck fracture.  SURGERY: Right hip hemiarthroplasty.  IMPLANTS: DePuy [place] hemiarthroplasty size 4 stem, size 51 head, +0 neck.  ESTIMATED BLOOD LOSS: 450 mL.  INTRAVENOUS FLUIDS: 1500 mL.  COMPLICATIONS: None.  DISPOSITION: Stable to the PACU.  INDICATIONS: The patient is a 81 year old male who was brought into [facility] Emergency Room on 11/20/2014 after a slip and fall at his place of residence of [place]. He was able to relate that he slipped and fell on a wet floor falling on his right side, complaining of right hip pain. X-rays were obtained in the Emergency Room showing a subcapital femoral neck fracture of the right hip. Orthopedics was consulted. The patient was indicated for a right hip hemiarthroplasty for both pain relief and mobilization and to prevent the complications related to prolonged bedrest. The patient walks with a walker at baseline and after a long discussion with the patient and his family, informed consent was obtained.  DESCRIPTION OF PROCEDURE: On the day of surgery, the patient was met in the preoperative holding area. All preoperative clearances were reviewed. Anesthesia visited with the patient. The nursing staff reviewed the consent, which was verified. The right hip was marked and the patient was taken to the operating room.  The patient was brought into the operating room and initial timeout done to ensure the identity of the patient. After that, anesthesia in the form of spinal anesthesia was administered along with 2 grams of IV Ancef. The patient was then positioned in the left lateral decubitus position with his right hip facing up on a pegboard. All bony prominences were well padded. Preoperative examination showed no skin lesions at the site of surgery, however, a small abrasion over his right knee as well as  bleeding from the tip of his penis at the entrance of a Foley catheter. The leg was cleaned with alcohol and then prepped and draped in a standard sterile fashion. A timeout was performed and a right posterolateral approach to the right hip was done using an 8 cm incision over the posterior aspect of the greater tuberosity using a knife to incise through skin and subcutaneous fat. An elevator was used to elevate the fat off the fascia. A deep knife was used to create a poke hole through the fascia and Metzenbaum scissors were used to incise the fascia distally and proximally. The Charnley retractors were placed with care taken to avoid the sciatic nerve. The bursa was elevated using electrocautery. At this point, a sharp Hohmann was placed over the superior aspect of the fractured femoral neck to retract the gluteus meatus, thus revealing the piriformis tendon which was tagged with an Ethibond suture and then released with electrocautery along with the short external rotators and the quadratus femoris. Cautery of the quadratus femoris resulted in a small amount of bleeding. As the middle femoral circumflex was cauterized, the joint capsule was revealed with its disruption secondary to the fracture. Several small bony fragments were attached to the capsular avulsions. These fragments were excised. The capsulotomy was completed in the shape of a T revealing the fractured femoral neck and the head of the femur sitting in the acetabulum. Using a femoral neck cutting guide, the lesser trochanter was used to reference 1 cm femoral neck cut. The set of this cut was marked, a saw was used to excise about 0.4 cm  of bone. Then, a corkscrew was used to remove the femoral head. The femoral neck was then exposed using a retractor and a box osteotome was used to reveal the canal. Canal finder was used to identify the path of the femoral canal and at this point a lateral reamer was gently entered into the canal to lateralize the  approach for the prosthesis. A size 1 broach was passed with ease into the canal, then removed with rasping along the way. A size 2 broach was then passed with slight difficulty, which was used as a rasp on the way out. Then a size 3 broach was passed into the stem with caution with frequent pauses to prevent fracture. Once seated completely, it was felt to be well secured and a trial head prosthesis was placed with a zero neck and a 51 head. The hip was reduced. The length was deemed nearly identical by assessing the tibial tubercles. The range of motion in the position of sleep and with hip and knee flexion and external rotation was deemed stable. The hip was then dislocated, and it was noticed that the stem had loosened slightly, so a size 4 stem was then passed with care and patience, and once completely seated that was the selected stem size. The femoral canal was prepared using a bacitracin solution and irrigation, was then dried and cement was mixed. A cement centralizer was trialed and placed and a size 2 cement stopper was placed in the canal with gentle mallet taps. Once the cement was mixed using a cement gun, it was placed and then pressurized, and the size 4 stem was placed with care ensuring anteversion and lateralization to prevent varus. For 15 minutes, the stem was held in place until the cement cured. Once cured, a size 51 head a zero neck were trialed, length and stability were deemed adequate and a 51 femoral head was placed with a standard neck. Once the hip was reduced, it was once again assessed with stability and length deemed adequate. The hip was irrigated copiously. Three drill holes were placed in the greater trochanter. Sutures were passed and a capsular  repair and piriformis musculotendinous capsular repair was completed. The fascia was closed with 0 Vicryl. The subcuticular stitch was done with 2-0 Vicryl. The skin was closed with staples. A sterile compressive dressing was placed and  a hip abduction pillow was placed. The patient was positioned in the supine position. The sponge and needle counts were correct at the end of the case. The patient was transferred to the PACU in stable condition. One x-ray was obtained showing the prosthesis in good position with near equivalent leg lengths.   ____________________________ Peterson Lombard, MD mdk:TT D: 11/21/2014 16:13:00 ET T: 11/21/2014 22:21:44 ET JOB#: [id number]  cc: Peterson Lombard, MD, <Dictator> Peterson Lombard MD ELECTRONICALLY SIGNED 12/28/2014 10:15
